# Patient Record
Sex: Male | Born: 1970
Health system: Southern US, Community
[De-identification: ages and names within clinical notes are randomized; demographics above are authoritative.]

## PROBLEM LIST (undated history)

## (undated) DIAGNOSIS — N529 Male erectile dysfunction, unspecified: Secondary | ICD-10-CM

## (undated) DIAGNOSIS — M47812 Spondylosis without myelopathy or radiculopathy, cervical region: Secondary | ICD-10-CM

## (undated) DIAGNOSIS — U071 COVID-19: Secondary | ICD-10-CM

## (undated) DIAGNOSIS — K219 Gastro-esophageal reflux disease without esophagitis: Secondary | ICD-10-CM

## (undated) DIAGNOSIS — F419 Anxiety disorder, unspecified: Secondary | ICD-10-CM

## (undated) DIAGNOSIS — E785 Hyperlipidemia, unspecified: Secondary | ICD-10-CM

## (undated) DIAGNOSIS — R112 Nausea with vomiting, unspecified: Secondary | ICD-10-CM

## (undated) DIAGNOSIS — Z9889 Other specified postprocedural states: Secondary | ICD-10-CM

## (undated) DIAGNOSIS — I10 Essential (primary) hypertension: Secondary | ICD-10-CM

## (undated) DIAGNOSIS — F439 Reaction to severe stress, unspecified: Secondary | ICD-10-CM

## (undated) HISTORY — DX: Reaction to severe stress, unspecified: F43.9

## (undated) HISTORY — DX: Male erectile dysfunction, unspecified: N52.9

## (undated) HISTORY — DX: Spondylosis without myelopathy or radiculopathy, cervical region: M47.812

## (undated) HISTORY — DX: Anxiety disorder, unspecified: F41.9

## (undated) HISTORY — DX: Gastro-esophageal reflux disease without esophagitis: K21.9

## (undated) HISTORY — DX: COVID-19: U07.1

## (undated) HISTORY — DX: Essential (primary) hypertension: I10

## (undated) HISTORY — DX: Hyperlipidemia, unspecified: E78.5

---

## 1990-10-27 HISTORY — PX: HERNIA REPAIR: SHX51

## 2003-10-12 ENCOUNTER — Inpatient Hospital Stay (HOSPITAL_COMMUNITY): Admission: EM | Admit: 2003-10-12 | Discharge: 2003-10-14 | Payer: Self-pay | Admitting: Emergency Medicine

## 2003-10-12 ENCOUNTER — Encounter (INDEPENDENT_AMBULATORY_CARE_PROVIDER_SITE_OTHER): Payer: Self-pay | Admitting: *Deleted

## 2003-10-28 HISTORY — PX: APPENDECTOMY: SHX54

## 2007-06-29 ENCOUNTER — Emergency Department (HOSPITAL_COMMUNITY): Admission: EM | Admit: 2007-06-29 | Discharge: 2007-06-30 | Payer: Self-pay | Admitting: Internal Medicine

## 2008-05-04 ENCOUNTER — Ambulatory Visit: Payer: Self-pay | Admitting: Licensed Clinical Social Worker

## 2010-09-27 ENCOUNTER — Encounter: Admission: RE | Admit: 2010-09-27 | Discharge: 2010-09-27 | Payer: Self-pay | Admitting: Otolaryngology

## 2011-03-14 NOTE — H&P (Signed)
NAME:  Andrew Cunningham, Andrew Cunningham                         ACCOUNT NO.:  [id number]   MEDICAL RECORD NO.:  [id number]                   PATIENT TYPE:  OBV   LOCATION:  0105                                 FACILITY:  [facility]   PHYSICIAN:  Ollen Gross. Vernell Morgans, M.D.              DATE OF BIRTH:  [birth year]   DATE OF ADMISSION:  10/12/2003  DATE OF DISCHARGE:                                HISTORY & PHYSICAL   Andrew Cunningham is a 40 year old white male who presents to the emergency  department today with abdominal pain since Monday.  He states the pain  initially started centrally and then moved to his right lower quadrant.  The  pain has become more severe over the last couple of days, it has been  associated with some nausea but no vomiting.  He denies any fevers.  He  otherwise denies any chest pain, shortness of breath, diarrhea or dysuria.  The rest of his review of systems unremarkable.   PAST MEDICAL HISTORY:  None.   PAST SURGICAL HISTORY:  None.   MEDICATIONS:  None.   ALLERGIES:  NONE.   SOCIAL HISTORY:  He denies the use of tobacco and occasionally drinks  alcohol.   FAMILY HISTORY:  Noncontributory.   PHYSICAL EXAMINATION:  His temp is 98.4, blood pressure 134/87, pulse of 98.  GENERAL:  He is a well-developed, well-nourished white male in no acute  distress.  SKIN:  Warm and dry with no jaundice.  EYES:  Extraocular muscles are intact.  Pupils are equal, round, react to  light.  Sclera nonicteric.  LUNGS:  Clear bilaterally with no use of accessory respiratory muscles.  HEART:  Regular rate and rhythm with an impulse in the left chest.  ABDOMEN:  Soft with some focal moderate right lower quadrant tenderness with  some guarding but no peritonitis.  I cannot palpate masses or  hepatosplenomegaly.  EXTREMITIES:  No cyanosis, clubbing or edema.  PSYCHOLOGIC:  He is alert and oriented x3 with no state of anxiety or  depression.   LABORATORY DATA:  On review of his lab work, his urine was  clean.  His white  count was 13,700, hemoglobin 15.9, hematocrit 46.3, platelet count 249,000.  Sodium was 135, potassium 4.5, chloride 103, CO2 28, BUN 13, creatinine 0.9,  glucose 110, calcium 10.3.   ASSESSMENT AND PLAN:  This is a 40 year old white male with focal right  lower quadrant pain and elevated white count.  His presentation is very  consistent with appendicitis and I would recommend that he undergo  laparoscopic appendectomy today.  I have discussed with him and his family  the risks and benefits of the operation as well as some of the technical  aspects and they understand and wish to proceed.  We will make arrangements  to do this for him in the next couple hours and will start broad-spectrum  antibiotic therapy.  Ollen Gross. Vernell Morgans, M.D.    PST/MEDQ  D:  10/12/2003  T:  10/12/2003  Job:  [id number]

## 2011-03-14 NOTE — Op Note (Signed)
NAME:  Andrew Cunningham, Andrew Cunningham                         ACCOUNT NO.:  [id number]   MEDICAL RECORD NO.:  [id number]                   PATIENT TYPE:  OBV   LOCATION:  0105                                 FACILITY:  [facility]   PHYSICIAN:  Ollen Gross. Vernell Morgans, M.D.              DATE OF BIRTH:  02/03/1971   DATE OF PROCEDURE:  10/12/2003  DATE OF DISCHARGE:                                 OPERATIVE REPORT   PREOPERATIVE DIAGNOSIS:  Appendicitis.   POSTOPERATIVE DIAGNOSIS:  Appendicitis.   PROCEDURE:  Laparoscopic appendectomy.   SURGEON:  Ollen Gross. Carolynne Edouard, M.D.   ANESTHESIA:  General endotracheal.   DESCRIPTION OF PROCEDURE:  After informed consent was obtained, the patient  was brought to the operating room and placed in a supine position on the  operating room table.  After adequate induction of general anesthesia, the  patient's abdomen was prepped with Betadine and draped in the usual sterile  manner.  The area below the umbilicus was then infiltrated with 0.25%  Marcaine.  A small incision was made with the 15 blade knife.  This incision  was carried down through the subcutaneous tissue bluntly with a Kelly clamp  and [employer] retractors until the linea alba was identified.  The linea  alba was incised with the 15 blade knife.  Each side was grasped with Kocher  clamps and elevated anteriorly.  The preperitoneal space was then probed  bluntly with a hemostat until the peritoneum was opened and access was  gained into the abdominal cavity.  A 0 Vicryl figure-of-eight stitch was  then placed in the fascia surrounding the opening.  A Hasson cannula was  placed through the opening and anchored in place with the previously-placed  Vicryl stitch.  The abdomen was then insufflated with carbon dioxide without  difficulty.  The patient was placed in Trendelenburg position with the right  side rotated up.  The right lower quadrant was inspected.  There appeared to  be an inflammatory mass in this area.  In  the epigastric region, an area was  infiltrated with 0.25% Marcaine.  A small stab incision was made with the 15  blade knife, and a 5 mm port was placed bluntly through this incision into  the abdominal cavity under direct vision.  In the suprapubic region, an area  was infiltrated with 0.25% Marcaine.  A small incision was made with the 15  blade knife, and an 11 mm port was placed bluntly through this incision into  the abdominal cavity under direct vision.  Blunt dissection was then carried  out in the right lower quadrant.  An enlarged appendix was identified.  That  appeared to be very inflamed.  The mesoappendix was taken down using the  harmonic scalpel and was hemostatic.  This dissection was carried to the  base of the appendix.  The base of the appendix at its junction with the  cecum was  identified and cleared of any debris.  Next, a laparoscopic GIA  stapler with a blue load was placed through the Hasson cannula.  The  laparoscope was moved to the suprapubic port.  The appendix was held up in  the air, and the stapling device was placed across the base of the appendix  near its junction with the cecum.  The stapling device was clamped.  A  minute was allowed to pass, and then the clamp was fired, thereby dividing  the base of the appendix between staple lines.  An endoscopic bag was then  placed through the Hasson cannula, and the appendix was placed within the  bag, and the bag was sealed.  The abdomen was then irrigated with copious  amounts of saline.  The staple line was then again inspected and was  completely hemostatic and intact.  The tissue appeared to be healthy.  The  rest of the abdomen was irrigated with copious amounts of saline.  There  were no other abnormalities noted on general inspection of the abdomen.  The  appendix was then moved with the bag through the infraumbilical port with  the Hasson cannula.  The fascial defect was then closed with the previously-   placed Vicryl stitch as well as with another interrupted 0 Vicryl stitch.  The rest of the ports were removed under direct vision and were found to be  hemostatic.  The fascia of the suprapubic port was closed with an  interrupted 0 Vicryl stitch.  The skin incisions were all closed with  interrupted 4-0 Monocryl subcuticular stitches.  Benzoin and Steri-Strips  were applied.  The patient tolerated the procedure well.  At the end of the  case, all needle, sponge, and instrument counts were correct.  The patient  was awakened and taken to the recovery room in stable condition.                                               Ollen Gross. Vernell Morgans, M.D.    PST/MEDQ  D:  10/12/2003  T:  10/12/2003  Job:  [id number]

## 2011-10-11 ENCOUNTER — Ambulatory Visit (INDEPENDENT_AMBULATORY_CARE_PROVIDER_SITE_OTHER): Payer: BC Managed Care – PPO

## 2011-10-11 DIAGNOSIS — J019 Acute sinusitis, unspecified: Secondary | ICD-10-CM

## 2012-11-29 IMAGING — CT CT MAXILLOFACIAL W/O CM
3 of 4 series · 17 of 30 positions shown, 19 images · non-contrast
Comparison: None.

CLINICAL DATA: Chronic sinusitis.

CT MAXILLOFACIAL WITHOUT CONTRAST
TECHNIQUE: Multidetector CT imaging of the maxillofacial
structures was performed. Multiplanar CT image reconstructions were
also generated.

[Series 3: sinuses detail · axial · 0.42mm/px · z∈[-15,+70]mm · 5 of 52 slices shown, 7 images]
[im 9/52  brain]
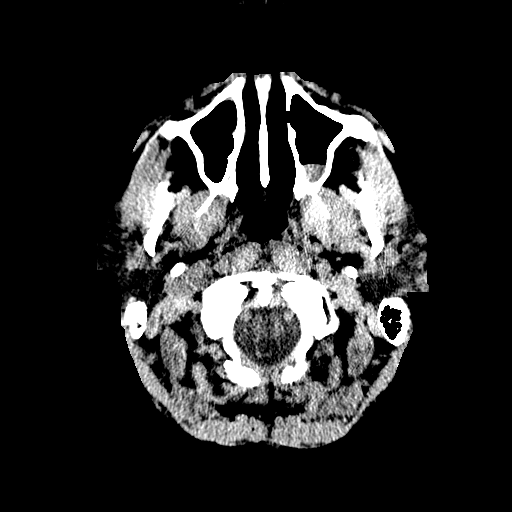
[im 9/52  bone]
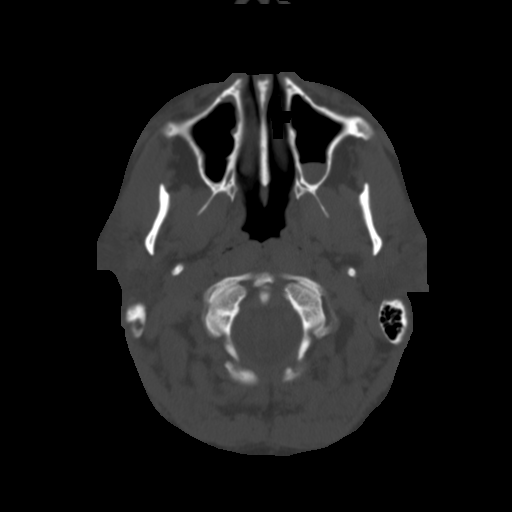
[im 18/52  bone]
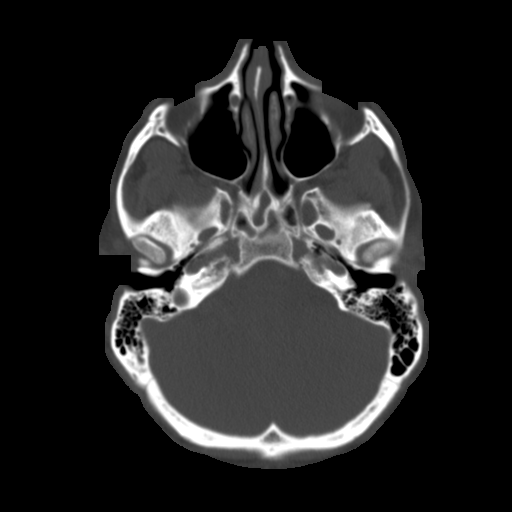
[im 26/52  bone]
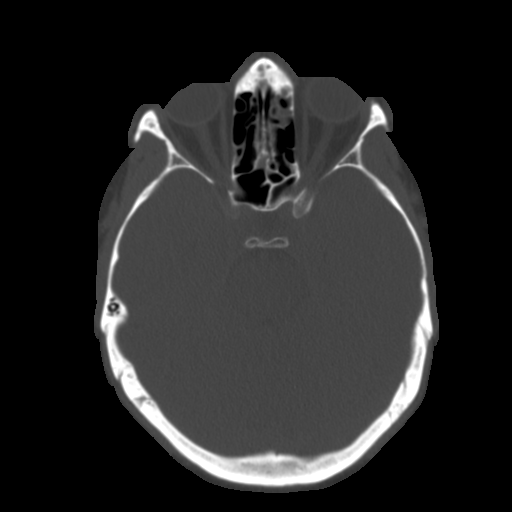
[im 35/52  bone]
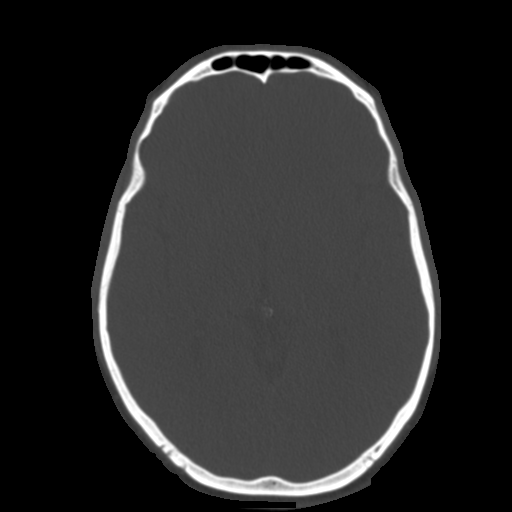
[im 43/52  brain]
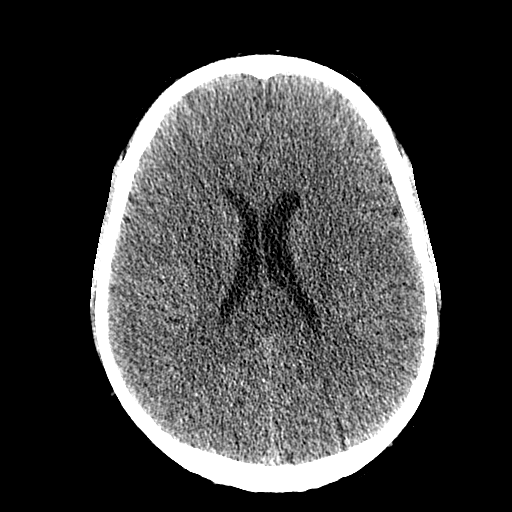
[im 43/52  bone]
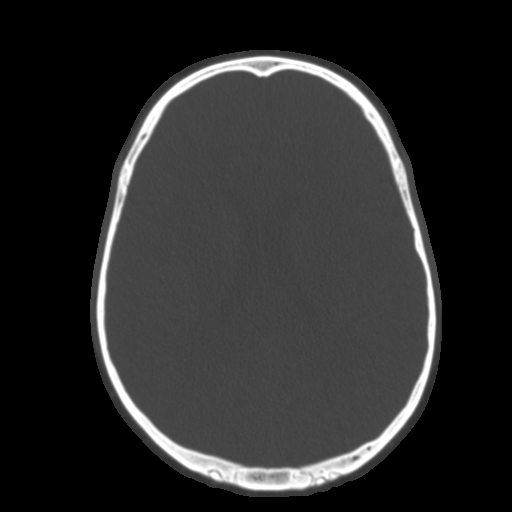

[Series 4: sinuses bone · axial · 0.42mm/px · z∈[-5,+68]mm · 4 of 49 slices shown]
[im 10/49  bone]
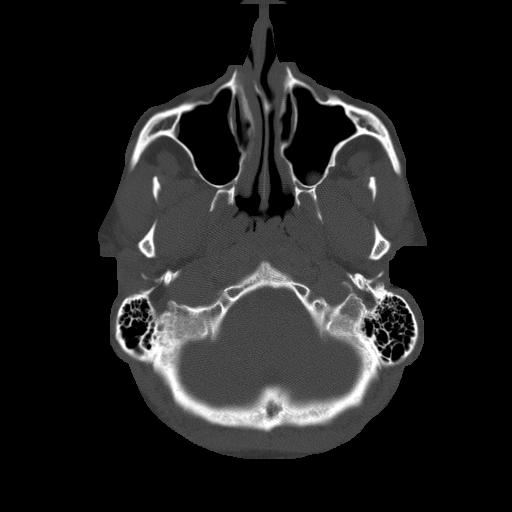
[im 20/49  bone]
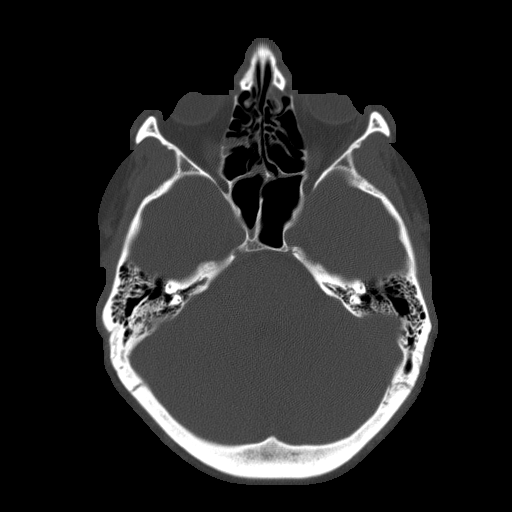
[im 29/49  bone]
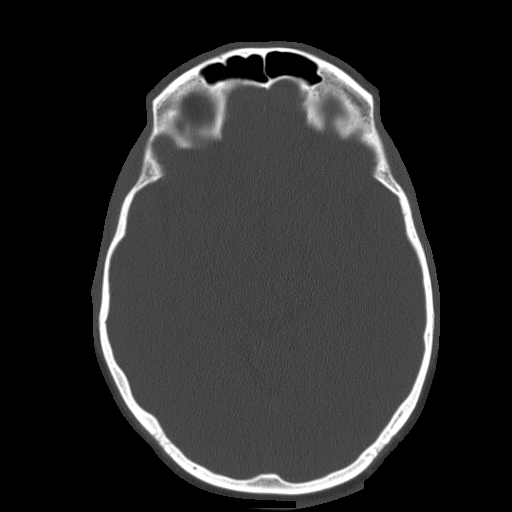
[im 39/49  bone]
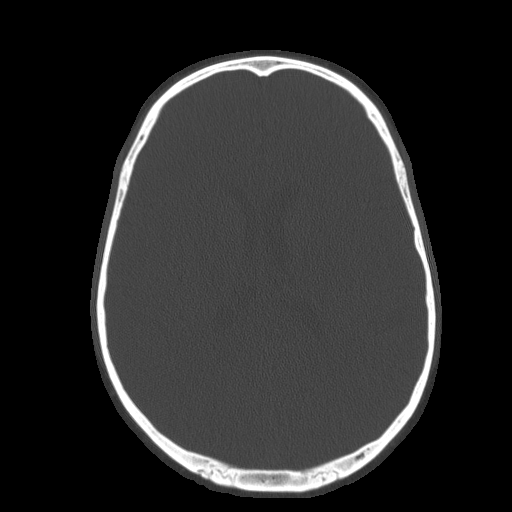

[Series 104: sag · sagittal · 0.33mm/px · 8 of 84 slices shown]
[im 10/84  bone]
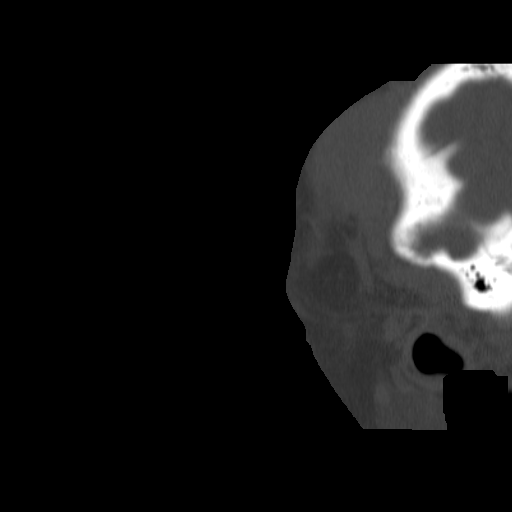
[im 19/84  bone]
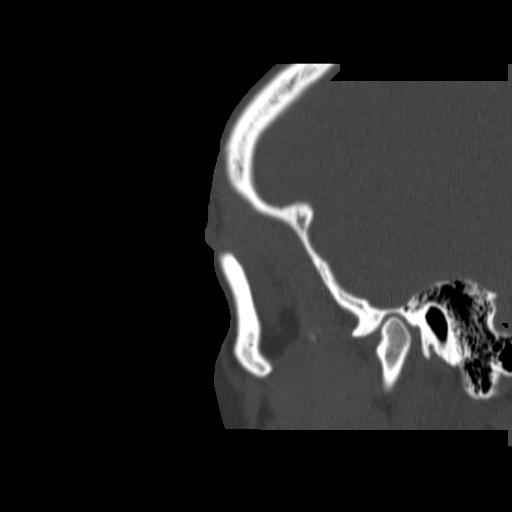
[im 28/84  bone]
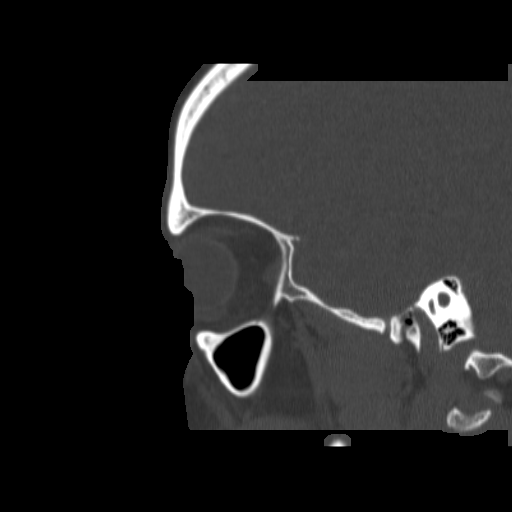
[im 37/84  bone]
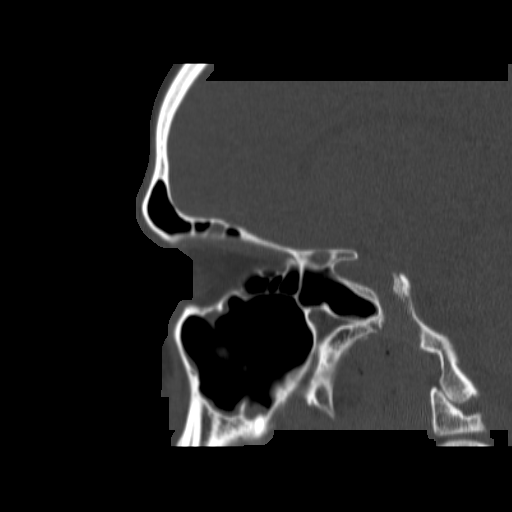
[im 47/84  bone]
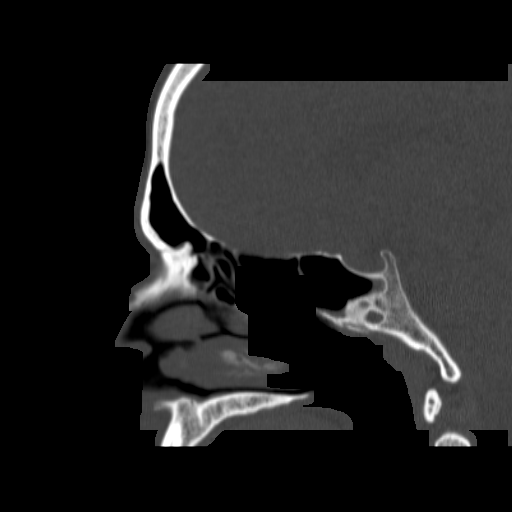
[im 56/84  bone]
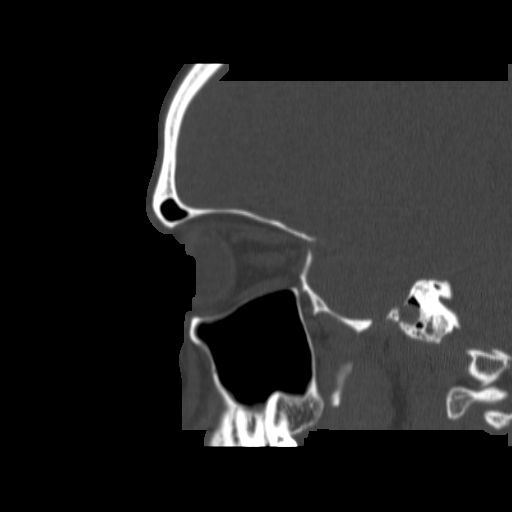
[im 65/84  bone]
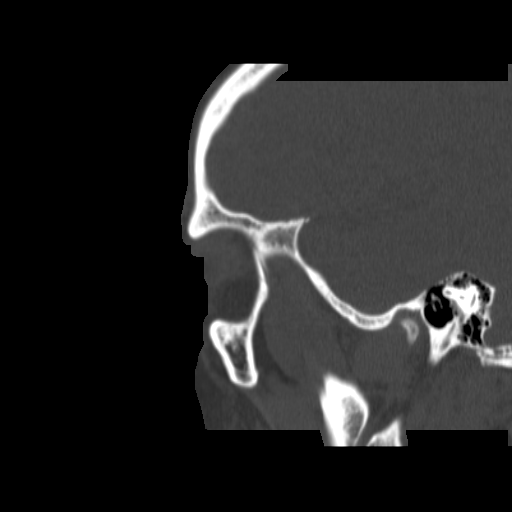
[im 74/84  bone]
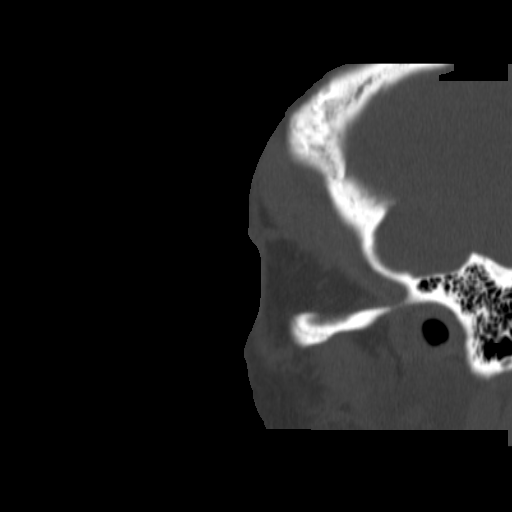

[17 of 30 positions shown; findings below may reference images not displayed]

FINDINGS: Focal soft tissue in the posterior left maxillary sinus
likely represents a small polyp or mucous retention cyst measuring
10 mm maximally.  Minimal mucosal thickening is noted along the
inferior aspect of the maxillary sinus bilaterally.  There is
partial opacification of the anterior left ethmoid air cells
extending towards the left frontal sinus.  The left frontal sinus
is clear.  The right frontal sinus is clear.  Minimal mucosal
thickening is present along the inferior aspect of the sphenoid
sinus bilaterally.  No air-fluid levels are present.  No focal bone
erosion or thickening is identified.

The ostiomeatal complex is present bilaterally.  The mastoid air
cells are clear.

Limited imaging of the brain is unremarkable.

The osseous skull is intact.
IMPRESSION: 1.  Small polyp or mucous retention cyst along the posterior aspect
of the left maxillary sinus.
2.  Minimal opacification of the anterior left ethmoid air cells
and frontoethmoidal recess without obstruction of the left frontal
sinus.
3.  Minimal mucosal thickening along the inferior aspect of the
sphenoid sinuses bilaterally.

## 2016-10-07 ENCOUNTER — Ambulatory Visit (INDEPENDENT_AMBULATORY_CARE_PROVIDER_SITE_OTHER): Payer: 59

## 2016-10-07 ENCOUNTER — Ambulatory Visit (INDEPENDENT_AMBULATORY_CARE_PROVIDER_SITE_OTHER): Payer: 59 | Admitting: Podiatry

## 2016-10-07 ENCOUNTER — Encounter: Payer: Self-pay | Admitting: Podiatry

## 2016-10-07 VITALS — BP 129/85 | HR 68 | Resp 16 | Ht 71.0 in | Wt 180.0 lb

## 2016-10-07 DIAGNOSIS — M79671 Pain in right foot: Secondary | ICD-10-CM

## 2016-10-07 DIAGNOSIS — M722 Plantar fascial fibromatosis: Secondary | ICD-10-CM

## 2016-10-07 DIAGNOSIS — M7661 Achilles tendinitis, right leg: Secondary | ICD-10-CM

## 2016-10-07 DIAGNOSIS — M79672 Pain in left foot: Secondary | ICD-10-CM

## 2016-10-07 MED ORDER — METHYLPREDNISOLONE 4 MG PO TBPK: ORAL_TABLET | ORAL | 0 refills | Status: DC

## 2016-10-07 MED ORDER — MELOXICAM 15 MG PO TABS: 15.0000 mg | ORAL_TABLET | Freq: Every day | ORAL | 3 refills | Status: DC

## 2016-10-07 NOTE — Patient Instructions (Signed)
Plantar Fasciitis Plantar fasciitis is a painful foot condition that affects the heel. It occurs when the band of tissue that connects the toes to the heel bone (plantar fascia) becomes irritated. This can happen after exercising too much or doing other repetitive activities (overuse injury). The pain from plantar fasciitis can range from mild irritation to severe pain that makes it difficult for you to walk or move. The pain is usually worse in the morning or after you have been sitting or lying down for a while. CAUSES This condition may be caused by:  Standing for long periods of time.  Wearing shoes that do not fit.  Doing high-impact activities, including running, aerobics, and ballet.  Being overweight.  Having an abnormal way of walking (gait).  Having tight calf muscles.  Having high arches in your feet.  Starting a new athletic activity. SYMPTOMS The main symptom of this condition is heel pain. Other symptoms include:  Pain that gets worse after activity or exercise.  Pain that is worse in the morning or after resting.  Pain that goes away after you walk for a few minutes. DIAGNOSIS This condition may be diagnosed based on your signs and symptoms. Your health care provider will also do a physical exam to check for:  A tender area on the bottom of your foot.  A high arch in your foot.  Pain when you move your foot.  Difficulty moving your foot. You may also need to have imaging studies to confirm the diagnosis. These can include:  X-rays.  Ultrasound.  MRI. TREATMENT  Treatment for plantar fasciitis depends on the severity of the condition. Your treatment may include:  Rest, ice, and over-the-counter pain medicines to manage your pain.  Exercises to stretch your calves and your plantar fascia.  A splint that holds your foot in a stretched, upward position while you sleep (night splint).  Physical therapy to relieve symptoms and prevent problems in the  future.  Cortisone injections to relieve severe pain.  Extracorporeal shock wave therapy (ESWT) to stimulate damaged plantar fascia with electrical impulses. It is often used as a last resort before surgery.  Surgery, if other treatments have not worked after 12 months. HOME CARE INSTRUCTIONS  Take medicines only as directed by your health care provider.  Avoid activities that cause pain.  Roll the bottom of your foot over a bag of ice or a bottle of cold water. Do this for 20 minutes, 3-4 times a day.  Perform simple stretches as directed by your health care provider.  Try wearing athletic shoes with air-sole or gel-sole cushions or soft shoe inserts.  Wear a night splint while sleeping, if directed by your health care provider.  Keep all follow-up appointments with your health care provider. PREVENTION   Do not perform exercises or activities that cause heel pain.  Consider finding low-impact activities if you continue to have problems.  Lose weight if you need to. The best way to prevent plantar fasciitis is to avoid the activities that aggravate your plantar fascia. SEEK MEDICAL CARE IF:  Your symptoms do not go away after treatment with home care measures.  Your pain gets worse.  Your pain affects your ability to move or do your daily activities. This information is not intended to replace advice given to you by your health care provider. Make sure you discuss any questions you have with your health care provider. Document Released: 07/08/2001 Document Revised: 02/04/2016 Document Reviewed: 08/23/2014 Elsevier Interactive Patient Education    2017 [employer]  

## 2016-10-07 NOTE — Progress Notes (Signed)
   Subjective:    Patient ID: Andrew Cunningham, male    DOB: 1970-11-29, 45 y.o.   MRN: [id number]  HPI: He presents today with chief complaint of pain to the posterior inferior aspect of the right heel times past 2 months. He also relates some left foot pain to the plantar aspect of the left foot states that it feels like the entire bottom of the foot is hurting.  Review of Systems  All other systems reviewed and are negative.      Objective:   Physical Exam: Vital signs are stable alert and oriented 3. Pulses are palpable. Neurologic sensorium is intact deep tendon reflexes are intact muscle strength is symmetrical and equal bilateral. Orthopedic evaluation straight awl joint systems available range of motion without crepitus. He does have pain on palpation of the posterior lateral insertion site of the Achilles right heel with what appears to be a very sharp lateral calcaneal margin or even small spur. He also has pain on palpation medial calcaneal tubercle of the left heel pain on medial and lateral compression of either heel and no pain on direct palpation posterior tibial tendon the Achilles tendon. Cutaneous evaluations are supple. Cutis no erythema cellulitis drainage or odor. Radiographs 3 views taken in the office today demonstrates soft tissue increased density of the plantar fascia calcaneal insertion site of the left heel and a small spur with minimal thickening of the Achilles and the posterior inferior aspect of the right calcaneus.        Assessment & Plan:  Insertional Achilles tendinitis right plantar fasciitis left.  Plan: I offered him injections but he declined today. I started him on a Medrol Dosepak to be followed by meloxicam. Since stretching exercises ice therapy and especially your modifications. I will follow-up with him in 1 month and we will consider orthotics and/or injections.

## 2016-10-09 ENCOUNTER — Telehealth: Payer: Self-pay | Admitting: *Deleted

## 2016-10-09 NOTE — Telephone Encounter (Signed)
Pt states he is unable to sleep while taking the prednisone and would like to know if he could take xanax. Left message informing pt that as long as it was his xanax it would be fine, but don't take at the same time.

## 2016-11-11 ENCOUNTER — Ambulatory Visit: Payer: 59 | Admitting: Podiatry

## 2017-02-27 DIAGNOSIS — Z0000 Encounter for general adult medical examination without abnormal findings: Secondary | ICD-10-CM | POA: Diagnosis not present

## 2017-02-27 DIAGNOSIS — K219 Gastro-esophageal reflux disease without esophagitis: Secondary | ICD-10-CM | POA: Diagnosis not present

## 2017-02-27 DIAGNOSIS — I10 Essential (primary) hypertension: Secondary | ICD-10-CM | POA: Diagnosis not present

## 2017-02-27 DIAGNOSIS — E7800 Pure hypercholesterolemia, unspecified: Secondary | ICD-10-CM | POA: Diagnosis not present

## 2017-02-27 DIAGNOSIS — R7309 Other abnormal glucose: Secondary | ICD-10-CM | POA: Diagnosis not present

## 2017-06-12 DIAGNOSIS — I10 Essential (primary) hypertension: Secondary | ICD-10-CM | POA: Diagnosis not present

## 2017-06-12 DIAGNOSIS — K219 Gastro-esophageal reflux disease without esophagitis: Secondary | ICD-10-CM | POA: Diagnosis not present

## 2017-06-12 DIAGNOSIS — E7800 Pure hypercholesterolemia, unspecified: Secondary | ICD-10-CM | POA: Diagnosis not present

## 2017-08-23 DIAGNOSIS — Z23 Encounter for immunization: Secondary | ICD-10-CM | POA: Diagnosis not present

## 2017-09-04 ENCOUNTER — Ambulatory Visit (INDEPENDENT_AMBULATORY_CARE_PROVIDER_SITE_OTHER): Payer: 59 | Admitting: Podiatry

## 2017-09-04 ENCOUNTER — Other Ambulatory Visit: Payer: Self-pay | Admitting: Podiatry

## 2017-09-04 ENCOUNTER — Ambulatory Visit (INDEPENDENT_AMBULATORY_CARE_PROVIDER_SITE_OTHER): Payer: 59

## 2017-09-04 ENCOUNTER — Encounter: Payer: Self-pay | Admitting: Podiatry

## 2017-09-04 VITALS — BP 139/94 | HR 65

## 2017-09-04 DIAGNOSIS — R52 Pain, unspecified: Secondary | ICD-10-CM

## 2017-09-04 DIAGNOSIS — M7661 Achilles tendinitis, right leg: Secondary | ICD-10-CM

## 2017-09-04 DIAGNOSIS — M722 Plantar fascial fibromatosis: Secondary | ICD-10-CM

## 2017-09-04 DIAGNOSIS — M79672 Pain in left foot: Secondary | ICD-10-CM

## 2017-09-04 NOTE — Patient Instructions (Signed)

## 2017-09-08 ENCOUNTER — Ambulatory Visit: Payer: 59 | Admitting: Orthotics

## 2017-09-08 DIAGNOSIS — R52 Pain, unspecified: Secondary | ICD-10-CM

## 2017-09-08 DIAGNOSIS — M7661 Achilles tendinitis, right leg: Secondary | ICD-10-CM

## 2017-09-08 DIAGNOSIS — M79671 Pain in right foot: Secondary | ICD-10-CM

## 2017-09-08 DIAGNOSIS — M79672 Pain in left foot: Secondary | ICD-10-CM

## 2017-09-08 NOTE — Progress Notes (Signed)
Patient came into today for casting bilateral f/o to address plantar fasciitis, metatarsalgia, and achilles tendon.  Patient reports history of foot pain involving plantar aponeurosis.  Goal is to provide longitudinal arch support and correct any RF instability due to heel eversion/inversion.  Ultimate goal is to relieve tension at pf insertion calcaneal tuberosity.  Plan on semi-rigid device addressing heel stability and relieving PF tension.

## 2017-09-30 ENCOUNTER — Encounter: Payer: 59 | Admitting: Orthotics

## 2017-10-02 ENCOUNTER — Ambulatory Visit: Payer: 59 | Admitting: Orthotics

## 2017-10-02 DIAGNOSIS — M7661 Achilles tendinitis, right leg: Secondary | ICD-10-CM

## 2017-10-04 NOTE — Progress Notes (Signed)
Patient picked up f/o; he was overall pleased with fit and function of said device; however he did feel that the arch wasn't hitting just right with the apex just proximal to mid arch.  He will wear a few days to see if foot adjusts; I checked them and it seemed like apex was in right place.

## 2017-10-05 ENCOUNTER — Encounter: Payer: 59 | Admitting: Orthotics

## 2017-11-06 DIAGNOSIS — C3432 Malignant neoplasm of lower lobe, left bronchus or lung: Secondary | ICD-10-CM | POA: Diagnosis not present

## 2017-11-06 DIAGNOSIS — R748 Abnormal levels of other serum enzymes: Secondary | ICD-10-CM | POA: Diagnosis not present

## 2017-11-06 DIAGNOSIS — Z5111 Encounter for antineoplastic chemotherapy: Secondary | ICD-10-CM | POA: Diagnosis not present

## 2017-11-06 DIAGNOSIS — R202 Paresthesia of skin: Secondary | ICD-10-CM | POA: Diagnosis not present

## 2017-11-27 DIAGNOSIS — C770 Secondary and unspecified malignant neoplasm of lymph nodes of head, face and neck: Secondary | ICD-10-CM | POA: Diagnosis not present

## 2017-11-27 DIAGNOSIS — C3432 Malignant neoplasm of lower lobe, left bronchus or lung: Secondary | ICD-10-CM | POA: Diagnosis not present

## 2017-11-27 DIAGNOSIS — Z5111 Encounter for antineoplastic chemotherapy: Secondary | ICD-10-CM | POA: Diagnosis not present

## 2017-11-30 DIAGNOSIS — E7800 Pure hypercholesterolemia, unspecified: Secondary | ICD-10-CM | POA: Diagnosis not present

## 2017-11-30 DIAGNOSIS — K219 Gastro-esophageal reflux disease without esophagitis: Secondary | ICD-10-CM | POA: Diagnosis not present

## 2017-11-30 DIAGNOSIS — I10 Essential (primary) hypertension: Secondary | ICD-10-CM | POA: Diagnosis not present

## 2017-12-28 DIAGNOSIS — M1711 Unilateral primary osteoarthritis, right knee: Secondary | ICD-10-CM | POA: Diagnosis not present

## 2017-12-28 DIAGNOSIS — M25561 Pain in right knee: Secondary | ICD-10-CM | POA: Diagnosis not present

## 2018-01-01 DIAGNOSIS — S83241D Other tear of medial meniscus, current injury, right knee, subsequent encounter: Secondary | ICD-10-CM | POA: Diagnosis not present

## 2018-01-01 DIAGNOSIS — M25561 Pain in right knee: Secondary | ICD-10-CM | POA: Diagnosis not present

## 2018-01-08 DIAGNOSIS — S83241D Other tear of medial meniscus, current injury, right knee, subsequent encounter: Secondary | ICD-10-CM | POA: Diagnosis not present

## 2018-01-11 DIAGNOSIS — H00012 Hordeolum externum right lower eyelid: Secondary | ICD-10-CM | POA: Diagnosis not present

## 2018-02-19 DIAGNOSIS — S83241D Other tear of medial meniscus, current injury, right knee, subsequent encounter: Secondary | ICD-10-CM | POA: Diagnosis not present

## 2018-03-16 ENCOUNTER — Ambulatory Visit: Payer: 59 | Admitting: Orthotics

## 2018-03-16 DIAGNOSIS — M7661 Achilles tendinitis, right leg: Secondary | ICD-10-CM

## 2018-03-16 DIAGNOSIS — M722 Plantar fascial fibromatosis: Secondary | ICD-10-CM

## 2018-03-16 NOTE — Progress Notes (Signed)
Adjust f/o:  suborthone (180), hug arch, remove met pad, 3* valgus wedge, 1/8" dancers pad skived distal and proximal to met heads.

## 2018-03-29 ENCOUNTER — Other Ambulatory Visit: Payer: 59 | Admitting: Orthotics

## 2018-04-05 ENCOUNTER — Ambulatory Visit: Payer: 59 | Admitting: Orthotics

## 2018-04-05 DIAGNOSIS — M7661 Achilles tendinitis, right leg: Secondary | ICD-10-CM

## 2018-04-05 DIAGNOSIS — M722 Plantar fascial fibromatosis: Secondary | ICD-10-CM

## 2018-04-05 NOTE — Progress Notes (Signed)
Patient still not completely happy with f/o; said he fell the apex of the f/o was too far distal now; however, it was placed on his feet and the apex hit perfectly at the middle of the arch with full contour.   Patient will try in atheltic shoes as he only brought in golf shoes.

## 2018-08-27 DIAGNOSIS — Z23 Encounter for immunization: Secondary | ICD-10-CM | POA: Diagnosis not present

## 2018-11-19 DIAGNOSIS — M19012 Primary osteoarthritis, left shoulder: Secondary | ICD-10-CM | POA: Diagnosis not present

## 2018-11-19 DIAGNOSIS — M722 Plantar fascial fibromatosis: Secondary | ICD-10-CM | POA: Diagnosis not present

## 2018-11-19 DIAGNOSIS — M25512 Pain in left shoulder: Secondary | ICD-10-CM | POA: Diagnosis not present

## 2020-05-28 ENCOUNTER — Other Ambulatory Visit: Payer: Self-pay | Admitting: Physician Assistant

## 2020-05-28 DIAGNOSIS — Z6825 Body mass index (BMI) 25.0-25.9, adult: Secondary | ICD-10-CM

## 2020-05-28 DIAGNOSIS — U071 COVID-19: Secondary | ICD-10-CM

## 2020-05-28 NOTE — Progress Notes (Signed)
I connected by phone with Andrew Cunningham on 05/28/2020 at 10:46 AM to discuss the potential use of a new treatment for mild to moderate COVID-19 viral infection in non-hospitalized patients.  This patient is a 49 y.o. male that meets the FDA criteria for Emergency Use Authorization of COVID monoclonal antibody casirivimab/imdevimab. Has a (+) direct SARS-CoV-2 viral test result Has mild or moderate COVID-19  Is NOT hospitalized due to COVID-19 Is within 10 days of symptom onset Has at least one of the high risk factor(s) for progression to severe COVID-19 and/or hospitalization as defined in EUA. Specific high risk criteria : BMI > 25   I have spoken and communicated the following to the patient or parent/caregiver regarding COVID monoclonal antibody treatment:  FDA has authorized the emergency use for the treatment of mild to moderate COVID-19 in adults and pediatric patients with positive results of direct SARS-CoV-2 viral testing who are 68 years of age and older weighing at least 40 kg, and who are at high risk for progressing to severe COVID-19 and/or hospitalization.  The significant known and potential risks and benefits of COVID monoclonal antibody, and the extent to which such potential risks and benefits are unknown.  Information on available alternative treatments and the risks and benefits of those alternatives, including clinical trials.  Patients treated with COVID monoclonal antibody should continue to self-isolate and use infection control measures (e.g., wear mask, isolate, social distance, avoid sharing personal items, clean and disinfect "high touch" surfaces, and frequent handwashing) according to CDC guidelines.   The patient or parent/caregiver has the option to accept or refuse COVID monoclonal antibody treatment.  After reviewing this information with the patient, The patient agreed to proceed with receiving casirivimab\imdevimab infusion and will be provided a copy of  the Fact sheet prior to receiving the infusion.  Sx onset 7/30. Set up for infusion on 8/4 @ 12:30 . Directions given to [facility]. Pt is aware that insurance will be charged an infusion fee.   Angelena Form 05/28/2020 10:46 AM

## 2020-05-30 ENCOUNTER — Ambulatory Visit (HOSPITAL_COMMUNITY): Payer: Self-pay

## 2020-08-09 NOTE — Progress Notes (Signed)
Office Visit Note  Patient: Andrew Cunningham             Date of Birth: [birth year]           MRN: [id number]             PCP: [name], MD Referring: [name], MD Visit Date: 08/22/2020 Occupation: @GUAROCC @  Subjective:  Neck pain.   History of Present Illness: Andrew Cunningham is a 49 y.o. male seen in consultation per request of his PCP.  According the patient he has had discomfort in his neck for the last few years.  He was seen by orthopedic surgeon in the past and was given Medrol pack without any help.  He denies any radiculopathy.  He states that the pain is worse in the morning and then it gets better.  He denies any nocturnal pain.  He is uncertain if he is a side sleeper or a back sleeper.  He denies any thoracic or lumbar pain.  Activities of Daily Living:  Patient reports morning stiffness for 0 minutes.   Patient Denies nocturnal pain.  Difficulty dressing/grooming: Denies Difficulty climbing stairs: Denies Difficulty getting out of chair: Denies Difficulty using hands for taps, buttons, cutlery, and/or writing: Denies  Review of Systems  Constitutional: Negative for fatigue.  HENT: Negative for mouth sores, mouth dryness and nose dryness.   Eyes: Negative for pain, itching and dryness.  Respiratory: Negative for shortness of breath and difficulty breathing.   Cardiovascular: Negative for chest pain and palpitations.  Gastrointestinal: Negative for blood in stool, constipation and diarrhea.  Endocrine: Negative for increased urination.  Genitourinary: Negative for difficulty urinating and painful urination.  Musculoskeletal: Positive for arthralgias and joint pain. Negative for joint swelling, myalgias, morning stiffness, muscle tenderness and myalgias.  Skin: Negative for color change, rash and redness.  Allergic/Immunologic: Negative for susceptible to infections.  Neurological: Negative for dizziness, numbness, headaches, memory loss and weakness.    Hematological: Negative for bruising/bleeding tendency.  Psychiatric/Behavioral: Negative for confusion and sleep disturbance.    PMFS History:  Patient Active Problem List   Diagnosis Date Noted  . Cervical spine arthritis 08/22/2020  . History of anxiety 08/22/2020  . History of gastroesophageal reflux (GERD) 08/22/2020  . Essential hypertension 08/22/2020  . History of hyperlipidemia 08/22/2020    Past Medical History:  Diagnosis Date  . Anxiety    per patient   . GERD (gastroesophageal reflux disease)    per patient   . Hyperlipemia    per patient   . Hypertension    per patient     Family History  Problem Relation Age of Onset  . Healthy Mother   . Arthritis Mother   . Healthy Father   . Healthy Sister   . Healthy Daughter    Past Surgical History:  Procedure Laterality Date  . APPENDECTOMY  2005  . [place]   Social History   Social History Narrative  . Not on file   Immunization History  Administered Date(s) Administered  . PFIZER SARS-COV-2 Vaccination 11/21/2019, 12/12/2019     Objective: Vital Signs: BP (!) 139/93 (BP Location: Right Arm, Patient Position: Sitting, Cuff Size: Normal)   Pulse 68   Resp 15   Ht 5\' 11"  (1.803 m)   Wt 187 lb (84.8 kg)   BMI 26.08 kg/m    Physical Exam Vitals and nursing note reviewed.  Constitutional:      Appearance: He is well-developed.  HENT:  Head: Normocephalic and atraumatic.  Eyes:     Conjunctiva/sclera: Conjunctivae normal.     Pupils: Pupils are equal, round, and reactive to light.  Cardiovascular:     Rate and Rhythm: Normal rate and regular rhythm.     Heart sounds: Normal heart sounds.  Pulmonary:     Effort: Pulmonary effort is normal.     Breath sounds: Normal breath sounds.  Abdominal:     General: Bowel sounds are normal.     Palpations: Abdomen is soft.  Musculoskeletal:     Cervical back: Normal range of motion and neck supple.  Skin:    General: Skin is warm and  dry.     Capillary Refill: Capillary refill takes less than 2 seconds.     Comments: Lipoma noted over left shoulder  Neurological:     Mental Status: He is alert and oriented to person, place, and time.  Psychiatric:        Behavior: Behavior normal.      Musculoskeletal Exam: C-spine was in good range of motion without any radiculopathy.  Thoracic and lumbar spine with good range of motion.  Shoulder joints, elbow joints, wrist joints, MCPs PIPs and DIPs in good range of motion.  He has mild DIP thickening.  Hip joints, knee joints, ankles, MTPs and PIPs with good range of motion with no synovitis.  CDAI Exam: CDAI Score: -- Patient Global: --; Provider Global: -- Swollen: --; Tender: -- Joint Exam 08/22/2020   No joint exam has been documented for this visit   There is currently no information documented on the homunculus. Go to the Rheumatology activity and complete the homunculus joint exam.  Investigation: No additional findings.  Imaging: XR Cervical Spine 2 or 3 views  Result Date: 08/22/2020 C5-C6 narrowing was noted with anterior osteophytes.  Facet joint arthropathy was noted. Impression: These findings are consistent with cervical spondylosis and facet joint arthropathy.   Recent Labs: No results found for: WBC, HGB, PLT, NA, K, CL, CO2, GLUCOSE, BUN, CREATININE, BILITOT, ALKPHOS, AST, ALT, PROT, ALBUMIN, CALCIUM, GFRAA, QFTBGOLD, QFTBGOLDPLUS  Speciality Comments: No specialty comments available.  Procedures:  No procedures performed Allergies: Patient has no known allergies.   Assessment / Plan:     Visit Diagnoses: Cervical spine arthritis -he complains of pain and stiffness in his cervical spine for the last few years.  He had no radiculopathy.  He has tried Medrol Dosepak in the past without relief.  Plan: XR Cervical Spine 2 or 3 views, x-rays obtained today showed C5C6 disc space narrowing and facet joint arthropathy.  Some neck exercises were demonstrated  in the office.  I will also refer him to physical therapy.  Ambulatory referral to Physical Therapy.  Have advised him to return on as needed basis if symptoms persist.  Chronic lower back pain without sciatica-he states he has some discomfort in his lower back when he is playing golf.  He takes Advil on as needed basis.  Have given him a handout on back exercises.  Osteoarthritis of bilateral hands-he has bilateral DIP thickening.  He is asymptomatic.  Joint protection was discussed.  History of anxiety  History of gastroesophageal reflux (GERD)  Essential hypertension  History of hyperlipidemia  Orders: Orders Placed This Encounter  Procedures  . XR Cervical Spine 2 or 3 views  . Ambulatory referral to Physical Therapy   No orders of the defined types were placed in this encounter.     Follow-Up Instructions: Return if symptoms worsen  or fail to improve, for Neck pain.   Bo Merino, MD  Note - This record has been created using Editor, commissioning.  Chart creation errors have been sought, but may not always  have been located. Such creation errors do not reflect on  the standard of medical care.

## 2020-08-22 ENCOUNTER — Ambulatory Visit: Payer: Self-pay

## 2020-08-22 ENCOUNTER — Encounter: Payer: Self-pay | Admitting: Rheumatology

## 2020-08-22 ENCOUNTER — Other Ambulatory Visit: Payer: Self-pay

## 2020-08-22 ENCOUNTER — Ambulatory Visit: Payer: No Typology Code available for payment source | Admitting: Rheumatology

## 2020-08-22 VITALS — BP 139/93 | HR 68 | Resp 15 | Ht 71.0 in | Wt 187.0 lb

## 2020-08-22 DIAGNOSIS — Z8659 Personal history of other mental and behavioral disorders: Secondary | ICD-10-CM | POA: Diagnosis not present

## 2020-08-22 DIAGNOSIS — G8929 Other chronic pain: Secondary | ICD-10-CM

## 2020-08-22 DIAGNOSIS — Z8639 Personal history of other endocrine, nutritional and metabolic disease: Secondary | ICD-10-CM

## 2020-08-22 DIAGNOSIS — M19042 Primary osteoarthritis, left hand: Secondary | ICD-10-CM

## 2020-08-22 DIAGNOSIS — M47812 Spondylosis without myelopathy or radiculopathy, cervical region: Secondary | ICD-10-CM | POA: Diagnosis not present

## 2020-08-22 DIAGNOSIS — M545 Low back pain, unspecified: Secondary | ICD-10-CM

## 2020-08-22 DIAGNOSIS — M79671 Pain in right foot: Secondary | ICD-10-CM

## 2020-08-22 DIAGNOSIS — M19041 Primary osteoarthritis, right hand: Secondary | ICD-10-CM

## 2020-08-22 DIAGNOSIS — Z8719 Personal history of other diseases of the digestive system: Secondary | ICD-10-CM

## 2020-08-22 DIAGNOSIS — I10 Essential (primary) hypertension: Secondary | ICD-10-CM

## 2020-08-22 NOTE — Patient Instructions (Addendum)
Cervical Strain and Sprain Rehab Ask your health care provider which exercises are safe for you. Do exercises exactly as told by your health care provider and adjust them as directed. It is normal to feel mild stretching, pulling, tightness, or discomfort as you do these exercises. Stop right away if you feel sudden pain or your pain gets worse. Do not begin these exercises until told by your health care provider. Stretching and range-of-motion exercises Cervical side bending  1. Using good posture, sit on a stable chair or stand up. 2. Without moving your shoulders, slowly tilt your left / right ear to your shoulder until you feel a stretch in the opposite side neck muscles. You should be looking straight ahead. 3. Hold for __________ seconds. 4. Repeat with the other side of your neck. Repeat __________ times. Complete this exercise __________ times a day. Cervical rotation  1. Using good posture, sit on a stable chair or stand up. 2. Slowly turn your head to the side as if you are looking over your left / right shoulder. ? Keep your eyes level with the ground. ? Stop when you feel a stretch along the side and the back of your neck. 3. Hold for __________ seconds. 4. Repeat this by turning to your other side. Repeat __________ times. Complete this exercise __________ times a day. Thoracic extension and pectoral stretch 1. Roll a towel or a small blanket so it is about 4 inches (10 cm) in diameter. 2. Lie down on your back on a firm surface. 3. Put the towel lengthwise, under your spine in the middle of your back. It should not be under your shoulder blades. The towel should line up with your spine from your middle back to your lower back. 4. Put your hands behind your head and let your elbows fall out to your sides. 5. Hold for __________ seconds. Repeat __________ times. Complete this exercise __________ times a day. Strengthening exercises Isometric upper cervical flexion 1. Lie on  your back with a thin pillow behind your head and a small rolled-up towel under your neck. 2. Gently tuck your chin toward your chest and nod your head down to look toward your feet. Do not lift your head off the pillow. 3. Hold for __________ seconds. 4. Release the tension slowly. Relax your neck muscles completely before you repeat this exercise. Repeat __________ times. Complete this exercise __________ times a day. Isometric cervical extension  1. Stand about 6 inches (15 cm) away from a wall, with your back facing the wall. 2. Place a soft object, about 6-8 inches (15-20 cm) in diameter, between the back of your head and the wall. A soft object could be a small pillow, a ball, or a folded towel. 3. Gently tilt your head back and press into the soft object. Keep your jaw and forehead relaxed. 4. Hold for __________ seconds. 5. Release the tension slowly. Relax your neck muscles completely before you repeat this exercise. Repeat __________ times. Complete this exercise __________ times a day. Posture and body mechanics Body mechanics refers to the movements and positions of your body while you do your daily activities. Posture is part of body mechanics. Good posture and healthy body mechanics can help to relieve stress in your body's tissues and joints. Good posture means that your spine is in its natural S-curve position (your spine is neutral), your shoulders are pulled back slightly, and your head is not tipped forward. The following are general guidelines for applying improved  posture and body mechanics to your everyday activities. Sitting  1. When sitting, keep your spine neutral and keep your feet flat on the floor. Use a footrest, if necessary, and keep your thighs parallel to the floor. Avoid rounding your shoulders, and avoid tilting your head forward. 2. When working at a desk or a computer, keep your desk at a height where your hands are slightly lower than your elbows. Slide your  chair under your desk so you are close enough to maintain good posture. 3. When working at a computer, place your monitor at a height where you are looking straight ahead and you do not have to tilt your head forward or downward to look at the screen. Standing   When standing, keep your spine neutral and keep your feet about hip-width apart. Keep a slight bend in your knees. Your ears, shoulders, and hips should line up.  When you do a task in which you stand in one place for a long time, place one foot up on a stable object that is 2-4 inches (5-10 cm) high, such as a footstool. This helps keep your spine neutral. Resting When lying down and resting, avoid positions that are most painful for you. Try to support your neck in a neutral position. You can use a contour pillow or a small rolled-up towel. Your pillow should support your neck but not push on it. This information is not intended to replace advice given to you by your health care provider. Make sure you discuss any questions you have with your health care provider. Document Revised: 02/02/2019 Document Reviewed: 07/14/2018 Elsevier Patient Education  2020 [place].  Back Exercises The following exercises strengthen the muscles that help to support the trunk and back. They also help to keep the lower back flexible. Doing these exercises can help to prevent back pain or lessen existing pain.  If you have back pain or discomfort, try doing these exercises 2-3 times each day or as told by your health care provider.  As your pain improves, do them once each day, but increase the number of times that you repeat the steps for each exercise (do more repetitions).  To prevent the recurrence of back pain, continue to do these exercises once each day or as told by your health care provider. Do exercises exactly as told by your health care provider and adjust them as directed. It is normal to feel mild stretching, pulling, tightness, or  discomfort as you do these exercises, but you should stop right away if you feel sudden pain or your pain gets worse. Exercises Single knee to chest Repeat these steps 3-5 times for each leg: 1. Lie on your back on a firm bed or the floor with your legs extended. 2. Bring one knee to your chest. Your other leg should stay extended and in contact with the floor. 3. Hold your knee in place by grabbing your knee or thigh with both hands and hold. 4. Pull on your knee until you feel a gentle stretch in your lower back or buttocks. 5. Hold the stretch for 10-30 seconds. 6. Slowly release and straighten your leg. Pelvic tilt Repeat these steps 5-10 times: 1. Lie on your back on a firm bed or the floor with your legs extended. 2. Bend your knees so they are pointing toward the ceiling and your feet are flat on the floor. 3. Tighten your lower abdominal muscles to press your lower back against the floor. This motion will  tilt your pelvis so your tailbone points up toward the ceiling instead of pointing to your feet or the floor. 4. With gentle tension and even breathing, hold this position for 5-10 seconds. Cat-cow Repeat these steps until your lower back becomes more flexible: 1. Get into a hands-and-knees position on a firm surface. Keep your hands under your shoulders, and keep your knees under your hips. You may place padding under your knees for comfort. 2. Let your head hang down toward your chest. Contract your abdominal muscles and point your tailbone toward the floor so your lower back becomes rounded like the back of a cat. 3. Hold this position for 5 seconds. 4. Slowly lift your head, let your abdominal muscles relax and point your tailbone up toward the ceiling so your back forms a sagging arch like the back of a cow. 5. Hold this position for 5 seconds.  Press-ups Repeat these steps 5-10 times: 1. Lie on your abdomen (face-down) on the floor. 2. Place your palms near your head, about  shoulder-width apart. 3. Keeping your back as relaxed as possible and keeping your hips on the floor, slowly straighten your arms to raise the top half of your body and lift your shoulders. Do not use your back muscles to raise your upper torso. You may adjust the placement of your hands to make yourself more comfortable. 4. Hold this position for 5 seconds while you keep your back relaxed. 5. Slowly return to lying flat on the floor.  Bridges Repeat these steps 10 times: 1. Lie on your back on a firm surface. 2. Bend your knees so they are pointing toward the ceiling and your feet are flat on the floor. Your arms should be flat at your sides, next to your body. 3. Tighten your buttocks muscles and lift your buttocks off the floor until your waist is at almost the same height as your knees. You should feel the muscles working in your buttocks and the back of your thighs. If you do not feel these muscles, slide your feet 1-2 inches farther away from your buttocks. 4. Hold this position for 3-5 seconds. 5. Slowly lower your hips to the starting position, and allow your buttocks muscles to relax completely. If this exercise is too easy, try doing it with your arms crossed over your chest. Abdominal crunches Repeat these steps 5-10 times: 1. Lie on your back on a firm bed or the floor with your legs extended. 2. Bend your knees so they are pointing toward the ceiling and your feet are flat on the floor. 3. Cross your arms over your chest. 4. Tip your chin slightly toward your chest without bending your neck. 5. Tighten your abdominal muscles and slowly raise your trunk (torso) high enough to lift your shoulder blades a tiny bit off the floor. Avoid raising your torso higher than that because it can put too much stress on your low back and does not help to strengthen your abdominal muscles. 6. Slowly return to your starting position. Back lifts Repeat these steps 5-10 times: 1. Lie on your abdomen  (face-down) with your arms at your sides, and rest your forehead on the floor. 2. Tighten the muscles in your legs and your buttocks. 3. Slowly lift your chest off the floor while you keep your hips pressed to the floor. Keep the back of your head in line with the curve in your back. Your eyes should be looking at the floor. 4. Hold this position for  3-5 seconds. 5. Slowly return to your starting position. Contact a health care provider if:  Your back pain or discomfort gets much worse when you do an exercise.  Your worsening back pain or discomfort does not lessen within 2 hours after you exercise. If you have any of these problems, stop doing these exercises right away. Do not do them again unless your health care provider says that you can. Get help right away if:  You develop sudden, severe back pain. If this happens, stop doing the exercises right away. Do not do them again unless your health care provider says that you can. This information is not intended to replace advice given to you by your health care provider. Make sure you discuss any questions you have with your health care provider. Document Revised: 02/17/2019 Document Reviewed: 07/15/2018 Elsevier Patient Education  [place].

## 2020-09-14 ENCOUNTER — Ambulatory Visit: Payer: Self-pay | Admitting: Rheumatology

## 2021-01-08 ENCOUNTER — Other Ambulatory Visit: Payer: Self-pay

## 2021-01-08 ENCOUNTER — Encounter: Payer: Self-pay | Admitting: Cardiology

## 2021-01-08 ENCOUNTER — Ambulatory Visit (INDEPENDENT_AMBULATORY_CARE_PROVIDER_SITE_OTHER): Payer: No Typology Code available for payment source | Admitting: Cardiology

## 2021-01-08 VITALS — BP 120/80 | HR 66 | Ht 71.0 in | Wt 190.0 lb

## 2021-01-08 DIAGNOSIS — I10 Essential (primary) hypertension: Secondary | ICD-10-CM

## 2021-01-08 DIAGNOSIS — Z8249 Family history of ischemic heart disease and other diseases of the circulatory system: Secondary | ICD-10-CM | POA: Diagnosis not present

## 2021-01-08 NOTE — Patient Instructions (Signed)
Medication Instructions:  The current medical regimen is effective;  continue present plan and medications.  *If you need a refill on your cardiac medications before your next appointment, please call your pharmacy*  Testing/Procedures: Your physician has requested that you have Coronary Calcium score which is completed by CT. Cardiac computed tomography (CT) is a painless test that uses an x-ray machine to take clear, detailed pictures of your heart.  This testing is done here at our office and the charge is $99 due at the time of the test.  There are no prior instruction.  Follow-Up: At [facility], you and your health needs are our priority.  As part of our continuing mission to provide you with exceptional heart care, we have created designated Provider Care Teams.  These Care Teams include your primary Cardiologist (physician) and Advanced Practice Providers (APPs -  Physician Assistants and Nurse Practitioners) who all work together to provide you with the care you need, when you need it.  We recommend signing up for the patient portal called "MyChart".  Sign up information is provided on this After Visit Summary.  MyChart is used to connect with patients for Virtual Visits (Telemedicine).  Patients are able to view lab/test results, encounter notes, upcoming appointments, etc.  Non-urgent messages can be sent to your provider as well.   To learn more about what you can do with MyChart, go to [email or web address].    Your next appointment:   12 month(s)  The format for your next appointment:   In Person  Provider:   Candee Furbish, MD  Thank you for choosing [facility]!!

## 2021-01-08 NOTE — Progress Notes (Signed)
Cardiology Office Note:    Date:  01/09/2021   ID:  Andrew Cunningham, DOB [birth year], MRN [id number]  PCP:  [name], MD   [place]  Cardiologist:  Candee Furbish, MD  Advanced Practice Provider:  No care team member to display Electrophysiologist:  None       Referring MD: [name], MD    History of Present Illness:    Andrew Cunningham is a 50 y.o. male here for the evaluation of family history of coronary artery disease at the request of Dr. Moreen Fowler.  In review of his office note from 11/09/2020 he has a family history of hypertension in both parents.  He has been taking losartan without any difficulties.  Also has hyperlipidemia, LDL 117 in 2020.  Taking statin without difficulty.  Anxiety-rarely uses propranolol prior to public speaking.  His wife was diagnosed with stage IV lung cancer in 2017.  His paternal grandfather had coronary artery disease, 6 MI's, died in his 59's. He did not smoke.   He is a former smoker quit in the year 2010.  Friends, co workers with MI.  Prompted him to get further evaluation.   Mild ETOH, eats healthy.   No CP, no SOB, no fevers chills nausea vomiting syncope  Overall he has been feeling quite well without any fevers chills nausea vomiting syncope bleeding.  Post COVID August 2021.  Past Medical History:  Diagnosis Date  . Anxiety    per patient   . Cervical spine arthritis   . COVID-19   . ED (erectile dysfunction)   . GERD (gastroesophageal reflux disease)    per patient   . Hyperlipemia    per patient   . Hypertension    per patient   . Situational stress     Past Surgical History:  Procedure Laterality Date  . APPENDECTOMY  2005  . HERNIA REPAIR  1992    Current Medications: Current Meds  Medication Sig  . ALPRAZolam (XANAX) 0.5 MG tablet Take 0.5 mg by mouth daily as needed.  . cetirizine (ZYRTEC) 10 MG tablet Take 10 mg by mouth daily.  Marland Kitchen losartan (COZAAR) 50 MG tablet Take 50 mg by  mouth daily.  Marland Kitchen omeprazole (PRILOSEC) 10 MG capsule Take 10 mg by mouth daily.  . propranolol (INDERAL) 20 MG tablet Take 20 mg by mouth as needed.  . rosuvastatin (CRESTOR) 5 MG tablet Take 5 mg by mouth daily.     Allergies:   Patient has no known allergies.   Social History   Socioeconomic History  . Marital status: Married    Spouse name: Not on file  . Number of children: Not on file  . Years of education: Not on file  . Highest education level: Not on file  Occupational History  . Not on file  Tobacco Use  . Smoking status: Former Research scientist (life sciences)  . Smokeless tobacco: Former Systems developer    Types: Secondary school teacher  . Vaping Use: Never used  Substance and Sexual Activity  . Alcohol use: Yes    Comment: 2 drinks daily   . Drug use: Not Currently  . Sexual activity: Not on file  Other Topics Concern  . Not on file  Social History Narrative  . Not on file   Social Determinants of Health   Financial Resource Strain: Not on file  Food Insecurity: Not on file  Transportation Needs: Not on file  Physical Activity: Not on file  Stress: Not  on file  Social Connections: Not on file     Family History: The patient's family history includes Arthritis in his mother; Healthy in his daughter, father, mother, and sister.  Coronary artery disease in grandparents  ROS:   Please see the history of present illness.     All other systems reviewed and are negative.  EKGs/Labs/Other Studies Reviewed:    The following studies were reviewed today: Referring office notes reviewed, lab work reviewed  EKG:  EKG is  ordered today.  The ekg ordered today demonstrates sinus rhythm 66 otherwise normal  Recent Labs: No results found for requested labs within last 8760 hours.  Recent Lipid Panel No results found for: CHOL, TRIG, HDL, CHOLHDL, VLDL, LDLCALC, LDLDIRECT  Total cholesterol 190, HDL 75, LDL 106, triglycerides 51, hemoglobin 15.6, creatinine 0.9, potassium 4.7, ALT 19, TSH 1.4 from  outside labs.  10/30/2020.  Risk Assessment/Calculations:      Physical Exam:    VS:  BP 120/80 (BP Location: Left Arm, Patient Position: Sitting, Cuff Size: Normal)   Pulse 66   Ht 5\' 11"  (1.803 m)   Wt 190 lb (86.2 kg)   SpO2 98%   BMI 26.50 kg/m     Wt Readings from Last 3 Encounters:  01/08/21 190 lb (86.2 kg)  08/22/20 187 lb (84.8 kg)  10/07/16 180 lb (81.6 kg)     GEN:  Well nourished, well developed in no acute distress HEENT: Normal NECK: No JVD; No carotid bruits LYMPHATICS: No lymphadenopathy CARDIAC: RRR, no murmurs, rubs, gallops RESPIRATORY:  Clear to auscultation without rales, wheezing or rhonchi  ABDOMEN: Soft, non-tender, non-distended MUSCULOSKELETAL:  No edema; No deformity  SKIN: Warm and dry NEUROLOGIC:  Alert and oriented x 3 PSYCHIATRIC:  Normal affect   ASSESSMENT:    1. Family history of early CAD   2. Essential hypertension    PLAN:    In order of problems listed above:  Family history of coronary artery disease -We will check a coronary calcium score.  $99.  He is an excellent candidate for this given his family history with his grandfather having heart attacks in his 20s.  If remarkable, will likely increase dose of rosuvastatin 5 mg that he is currently taking.  Hyperlipidemia -Currently on rosuvastatin 5 mg.  LDL 106 currently.  ALT 19.  Essential hypertension -Doing well on losartan.  No side effects. -Continue to promote healthy diet and exercise.  Alcohol in moderation.    1 year follow-up for prevention.  Medication Adjustments/Labs and Tests Ordered: Current medicines are reviewed at length with the patient today.  Concerns regarding medicines are outlined above.  Orders Placed This Encounter  Procedures  . CT CARDIAC SCORING (SELF PAY ONLY)  . EKG 12-Lead   No orders of the defined types were placed in this encounter.   Patient Instructions  Medication Instructions:  The current medical regimen is effective;   continue present plan and medications.  *If you need a refill on your cardiac medications before your next appointment, please call your pharmacy*  Testing/Procedures: Your physician has requested that you have Coronary Calcium score which is completed by CT. Cardiac computed tomography (CT) is a painless test that uses an x-ray machine to take clear, detailed pictures of your heart.  This testing is done here at our office and the charge is $99 due at the time of the test.  There are no prior instruction.  Follow-Up: At [facility], you and your health needs are our  priority.  As part of our continuing mission to provide you with exceptional heart care, we have created designated Provider Care Teams.  These Care Teams include your primary Cardiologist (physician) and Advanced Practice Providers (APPs -  Physician Assistants and Nurse Practitioners) who all work together to provide you with the care you need, when you need it.  We recommend signing up for the patient portal called "MyChart".  Sign up information is provided on this After Visit Summary.  MyChart is used to connect with patients for Virtual Visits (Telemedicine).  Patients are able to view lab/test results, encounter notes, upcoming appointments, etc.  Non-urgent messages can be sent to your provider as well.   To learn more about what you can do with MyChart, go to [email or web address].    Your next appointment:   12 month(s)  The format for your next appointment:   In Person  Provider:   Candee Furbish, MD  Thank you for choosing [facility]!!        Signed, Candee Furbish, MD  01/09/2021 10:15 AM    [place]

## 2021-02-05 ENCOUNTER — Other Ambulatory Visit: Payer: Self-pay

## 2021-02-05 ENCOUNTER — Inpatient Hospital Stay: Admission: RE | Admit: 2021-02-05 | Payer: Self-pay | Source: Ambulatory Visit

## 2021-02-05 ENCOUNTER — Ambulatory Visit (INDEPENDENT_AMBULATORY_CARE_PROVIDER_SITE_OTHER)
Admission: RE | Admit: 2021-02-05 | Discharge: 2021-02-05 | Disposition: A | Discharge status: Home / Self Care | Payer: Self-pay | Source: Ambulatory Visit | Attending: Cardiology | Admitting: Cardiology

## 2021-02-05 DIAGNOSIS — Z8249 Family history of ischemic heart disease and other diseases of the circulatory system: Secondary | ICD-10-CM

## 2021-02-05 DIAGNOSIS — I10 Essential (primary) hypertension: Secondary | ICD-10-CM

## 2021-09-04 ENCOUNTER — Other Ambulatory Visit (HOSPITAL_BASED_OUTPATIENT_CLINIC_OR_DEPARTMENT_OTHER): Payer: Self-pay

## 2021-09-04 MED ORDER — INFLUENZA VAC SPLIT QUAD 0.5 ML IM SUSY
PREFILLED_SYRINGE | INTRAMUSCULAR | 0 refills | Status: AC
  Filled 2021-09-04: qty 0.5, 1d supply, fill #0

## 2022-09-17 ENCOUNTER — Ambulatory Visit (INDEPENDENT_AMBULATORY_CARE_PROVIDER_SITE_OTHER): Payer: No Typology Code available for payment source

## 2022-09-17 ENCOUNTER — Ambulatory Visit (INDEPENDENT_AMBULATORY_CARE_PROVIDER_SITE_OTHER): Payer: No Typology Code available for payment source | Admitting: Orthopaedic Surgery

## 2022-09-17 ENCOUNTER — Encounter (HOSPITAL_BASED_OUTPATIENT_CLINIC_OR_DEPARTMENT_OTHER): Payer: Self-pay | Admitting: Orthopaedic Surgery

## 2022-09-17 DIAGNOSIS — M25511 Pain in right shoulder: Secondary | ICD-10-CM

## 2022-09-17 DIAGNOSIS — G8929 Other chronic pain: Secondary | ICD-10-CM

## 2022-09-17 NOTE — Progress Notes (Signed)
Chief Complaint: Right shoulder pain     History of Present Illness:    Andrew Cunningham is a 51 y.o. male presents today with right shoulder pain which has been ongoing for 6 months.  He states that he has been experiencing this pain after he had a forehead ball and tennis.  Since that time he has had pain about the lateral and posterior aspect of the shoulder.  This has not resolved with over 6 weeks of physical therapy at this time.  This is his dominant arm.  He works in IT trainer.  He is taking Advil as needed.  He has been resting it and attempting to modify his activities without any relief.  He was seen at [facility].    Surgical History:   None  PMH/PSH/Family History/Social History/Meds/Allergies:    Past Medical History:  Diagnosis Date   Anxiety    per patient    Cervical spine arthritis    COVID-19    ED (erectile dysfunction)    GERD (gastroesophageal reflux disease)    per patient    Hyperlipemia    per patient    Hypertension    per patient    Situational stress    Past Surgical History:  Procedure Laterality Date   APPENDECTOMY  2005   HERNIA REPAIR  1992   Social History   Socioeconomic History   Marital status: Married    Spouse name: Not on file   Number of children: Not on file   Years of education: Not on file   Highest education level: Not on file  Occupational History   Not on file  Tobacco Use   Smoking status: Former   Smokeless tobacco: Former    Types: Nurse, children's Use: Never used  Substance and Sexual Activity   Alcohol use: Yes    Comment: 2 drinks daily    Drug use: Not Currently   Sexual activity: Not on file  Other Topics Concern   Not on file  Social History Narrative   Not on file   Social Determinants of Health   Financial Resource Strain: Not on file  Food Insecurity: Not on file  Transportation Needs: Not on file  Physical Activity: Not on  file  Stress: Not on file  Social Connections: Not on file   Family History  Problem Relation Age of Onset   Healthy Mother    Arthritis Mother    Healthy Father    Healthy Sister    Healthy Daughter    No Known Allergies Current Outpatient Medications  Medication Sig Dispense Refill   ALPRAZolam (XANAX) 0.5 MG tablet Take 0.5 mg by mouth daily as needed.     cetirizine (ZYRTEC) 10 MG tablet Take 10 mg by mouth daily.     influenza vac split quadrivalent PF (FLUARIX) 0.5 ML injection Inject into the muscle. 0.5 mL 0   losartan (COZAAR) 50 MG tablet Take 50 mg by mouth daily.     omeprazole (PRILOSEC) 10 MG capsule Take 10 mg by mouth daily.     propranolol (INDERAL) 20 MG tablet Take 20 mg by mouth as needed.     rosuvastatin (CRESTOR) 5 MG tablet Take 5 mg by mouth daily.     No current facility-administered medications for  this visit.   No results found.  Review of Systems:   A ROS was performed including pertinent positives and negatives as documented in the HPI.  Physical Exam :   Constitutional: NAD and appears stated age Neurological: Alert and oriented Psych: Appropriate affect and cooperative There were no vitals taken for this visit.   Comprehensive Musculoskeletal Exam:    Musculoskeletal Exam    Inspection Right Left  Skin No atrophy or winging No atrophy or winging  Palpation    Tenderness Lateral shoulder None  Range of Motion    Flexion (passive) 170 170  Flexion (active) 170 170  Abduction 170 170  ER at the side 70 70  Can reach behind back to T12 T12  Strength     Weakness with external rotation and Hornblower none  Special Tests    Pseudoparalytic No No  Neurologic    Fires PIN, radial, median, ulnar, musculocutaneous, axillary, suprascapular, long thoracic, and spinal accessory innervated muscles. No abnormal sensibility  Vascular/Lymphatic    Radial Pulse 2+ 2+  Cervical Exam    Patient has symmetric cervical range of motion with negative  Spurling's test.  Special Test: Negative belly press     Imaging:   Xray (three-view right shoulder): There is a focus of calcium superior to the glenoid consistent with possible retracted tear versus calcified labrum    I personally reviewed and interpreted the radiographs.   Assessment:   51 y.o. male right-hand-dominant male presents with right shoulder pain and weakness now ongoing for 6 months.  At this time he has tried activity restriction and activity modification.  He has done physical therapy for strengthening of the shoulder.  He is trialed anti-inflammatories.  Given the fact that he is failed all of these modalities I do believe that an MRI is indicated in order to rule out an underlying retraction of this focus of calcific tendinitis.  Plan :    -Return to clinic following MRI right shoulder   I personally saw and evaluated the patient, and participated in the management and treatment plan.  Vanetta Mulders, MD Attending Physician, Orthopedic Surgery  This document was dictated using Dragon voice recognition software. A reasonable attempt at proof reading has been made to minimize errors.

## 2022-09-23 ENCOUNTER — Telehealth: Payer: Self-pay | Admitting: Orthopaedic Surgery

## 2022-09-23 NOTE — Telephone Encounter (Signed)
Spoke to pt directly and explained MRI is still pending insurance authorization. No further questions  asked

## 2022-09-23 NOTE — Telephone Encounter (Signed)
Pt called in stating that he haven't heard from anyone yet about his MRI being scheduled... Pt was wondering what facility was he referred to for the MRI... Pt requesting callback.Marland KitchenMarland Kitchen

## 2022-10-12 ENCOUNTER — Ambulatory Visit
Admission: RE | Admit: 2022-10-12 | Discharge: 2022-10-12 | Disposition: A | Discharge status: Home / Self Care | Payer: No Typology Code available for payment source | Source: Ambulatory Visit | Attending: Orthopaedic Surgery | Admitting: Orthopaedic Surgery

## 2022-10-12 DIAGNOSIS — G8929 Other chronic pain: Secondary | ICD-10-CM

## 2022-10-15 ENCOUNTER — Ambulatory Visit (INDEPENDENT_AMBULATORY_CARE_PROVIDER_SITE_OTHER): Payer: No Typology Code available for payment source | Admitting: Orthopaedic Surgery

## 2022-10-15 ENCOUNTER — Other Ambulatory Visit (HOSPITAL_BASED_OUTPATIENT_CLINIC_OR_DEPARTMENT_OTHER): Payer: Self-pay | Admitting: Orthopaedic Surgery

## 2022-10-15 DIAGNOSIS — S43431A Superior glenoid labrum lesion of right shoulder, initial encounter: Secondary | ICD-10-CM

## 2022-10-15 NOTE — H&P (View-Only) (Signed)
Chief Complaint: Right shoulder pain     History of Present Illness:   10/15/2022: Presents today for follow-up of his right shoulder MRI.  At this time he is continuing to have somewhat of a painful shoulder which is now radiating to the upper neck.  He tried to recently play golf and this was very limited following with resulting shoulder pain.  He is here today for further discussion.  Andrew Cunningham is a 51 y.o. male presents today with right shoulder pain which has been ongoing for 6 months.  He states that he has been experiencing this pain after he had a forehead ball and tennis.  Since that time he has had pain about the lateral and posterior aspect of the shoulder.  This has not resolved with over 6 weeks of physical therapy at this time.  This is his dominant arm.  He works in Visual merchandiser.  He is taking Advil as needed.  He has been resting it and attempting to modify his activities without any relief.  He was seen at [facility].    Surgical History:   None  PMH/PSH/Family History/Social History/Meds/Allergies:    Past Medical History:  Diagnosis Date   Anxiety    per patient    Cervical spine arthritis    COVID-19    ED (erectile dysfunction)    GERD (gastroesophageal reflux disease)    per patient    Hyperlipemia    per patient    Hypertension    per patient    Situational stress    Past Surgical History:  Procedure Laterality Date   APPENDECTOMY  2005   HERNIA REPAIR  1992   Social History   Socioeconomic History   Marital status: Married    Spouse name: Not on file   Number of children: Not on file   Years of education: Not on file   Highest education level: Not on file  Occupational History   Not on file  Tobacco Use   Smoking status: Former   Smokeless tobacco: Former    Types: Associate Professor Use: Never used  Substance and Sexual Activity   Alcohol use: Yes    Comment: 2 drinks  daily    Drug use: Not Currently   Sexual activity: Not on file  Other Topics Concern   Not on file  Social History Narrative   Not on file   Social Determinants of Health   Financial Resource Strain: Not on file  Food Insecurity: Not on file  Transportation Needs: Not on file  Physical Activity: Not on file  Stress: Not on file  Social Connections: Not on file   Family History  Problem Relation Age of Onset   Healthy Mother    Arthritis Mother    Healthy Father    Healthy Sister    Healthy Daughter    No Known Allergies Current Outpatient Medications  Medication Sig Dispense Refill   ALPRAZolam (XANAX) 0.5 MG tablet Take 0.5 mg by mouth daily as needed.     cetirizine (ZYRTEC) 10 MG tablet Take 10 mg by mouth daily.     influenza vac split quadrivalent PF (FLUARIX) 0.5 ML injection Inject into the muscle. 0.5 mL 0   losartan (COZAAR) 50 MG tablet Take 50 mg by  mouth daily.     omeprazole (PRILOSEC) 10 MG capsule Take 10 mg by mouth daily.     propranolol (INDERAL) 20 MG tablet Take 20 mg by mouth as needed.     rosuvastatin (CRESTOR) 5 MG tablet Take 5 mg by mouth daily.     No current facility-administered medications for this visit.   No results found.  Review of Systems:   A ROS was performed including pertinent positives and negatives as documented in the HPI.  Physical Exam :   Constitutional: NAD and appears stated age Neurological: Alert and oriented Psych: Appropriate affect and cooperative There were no vitals taken for this visit.   Comprehensive Musculoskeletal Exam:    Musculoskeletal Exam    Inspection Right Left  Skin No atrophy or winging No atrophy or winging  Palpation    Tenderness Lateral shoulder, glenohumeral None  Range of Motion    Flexion (passive) 170 170  Flexion (active) 170 170  Abduction 170 170  ER at the side 70 70  Can reach behind back to T12 T12  Strength     Weakness with external rotation and Hornblower none  Special  Tests    Pseudoparalytic No No  Neurologic    Fires PIN, radial, median, ulnar, musculocutaneous, axillary, suprascapular, long thoracic, and spinal accessory innervated muscles. No abnormal sensibility  Vascular/Lymphatic    Radial Pulse 2+ 2+  Cervical Exam    Patient has symmetric cervical range of motion with negative Spurling's test.  Special Test: Negative belly press     Imaging:   Xray (three-view right shoulder): There is a focus of calcium superior consistent with labral tearing   MRI right shoulder: Anterior as well as posterior shoulder labral tear with a large posterior glenohumeral cyst and what appears to be a loose body in the anterior aspect of the shoulder  I personally reviewed and interpreted the radiographs.   Assessment:   51 y.o. male right-hand-dominant male presents with right shoulder pain and weakness now ongoing for 6 months.  At this time he has tried activity restriction and activity modification.  He has done physical therapy for strengthening of the shoulder.  He is trialed anti-inflammatories.  I did discuss that his exam is consistent with a labral tear with an intra-articular loose body.  Given the fact that there is a loose body we discussed the role for arthroscopic intervention and debridement with labral repair as needed.  I discussed the rehab associated with this.  I did also discuss an additional option would be to try steroid injection particularly as he has an upcoming trip to [place] for which she will be doing significant golfing.  I advised that this is also a possibility.  He would like to consider his options and he will call us back to let us know  Plan :    -He will call us back if interested in right shoulder injection versus right shoulder arthroscopy with labral repair and loose body removal   I personally saw and evaluated the patient, and participated in the management and treatment plan.  Huel Cote, MD Attending  Physician, Orthopedic Surgery  This document was dictated using Dragon voice recognition software. A reasonable attempt at proof reading has been made to minimize errors.

## 2022-10-15 NOTE — Progress Notes (Signed)
Chief Complaint: Right shoulder pain     History of Present Illness:   10/15/2022: Presents today for follow-up of his right shoulder MRI.  At this time he is continuing to have somewhat of a painful shoulder which is now radiating to the upper neck.  He tried to recently play golf and this was very limited following with resulting shoulder pain.  He is here today for further discussion.  Andrew Cunningham is a 51 y.o. male presents today with right shoulder pain which has been ongoing for 6 months.  He states that he has been experiencing this pain after he had a forehead ball and tennis.  Since that time he has had pain about the lateral and posterior aspect of the shoulder.  This has not resolved with over 6 weeks of physical therapy at this time.  This is his dominant arm.  He works in IT trainer.  He is taking Advil as needed.  He has been resting it and attempting to modify his activities without any relief.  He was seen at [facility].    Surgical History:   None  PMH/PSH/Family History/Social History/Meds/Allergies:    Past Medical History:  Diagnosis Date   Anxiety    per patient    Cervical spine arthritis    COVID-19    ED (erectile dysfunction)    GERD (gastroesophageal reflux disease)    per patient    Hyperlipemia    per patient    Hypertension    per patient    Situational stress    Past Surgical History:  Procedure Laterality Date   APPENDECTOMY  2005   HERNIA REPAIR  1992   Social History   Socioeconomic History   Marital status: Married    Spouse name: Not on file   Number of children: Not on file   Years of education: Not on file   Highest education level: Not on file  Occupational History   Not on file  Tobacco Use   Smoking status: Former   Smokeless tobacco: Former    Types: Nurse, children's Use: Never used  Substance and Sexual Activity   Alcohol use: Yes    Comment: 2 drinks  daily    Drug use: Not Currently   Sexual activity: Not on file  Other Topics Concern   Not on file  Social History Narrative   Not on file   Social Determinants of Health   Financial Resource Strain: Not on file  Food Insecurity: Not on file  Transportation Needs: Not on file  Physical Activity: Not on file  Stress: Not on file  Social Connections: Not on file   Family History  Problem Relation Age of Onset   Healthy Mother    Arthritis Mother    Healthy Father    Healthy Sister    Healthy Daughter    No Known Allergies Current Outpatient Medications  Medication Sig Dispense Refill   ALPRAZolam (XANAX) 0.5 MG tablet Take 0.5 mg by mouth daily as needed.     cetirizine (ZYRTEC) 10 MG tablet Take 10 mg by mouth daily.     influenza vac split quadrivalent PF (FLUARIX) 0.5 ML injection Inject into the muscle. 0.5 mL 0   losartan (COZAAR) 50 MG tablet Take 50 mg by  mouth daily.     omeprazole (PRILOSEC) 10 MG capsule Take 10 mg by mouth daily.     propranolol (INDERAL) 20 MG tablet Take 20 mg by mouth as needed.     rosuvastatin (CRESTOR) 5 MG tablet Take 5 mg by mouth daily.     No current facility-administered medications for this visit.   No results found.  Review of Systems:   A ROS was performed including pertinent positives and negatives as documented in the HPI.  Physical Exam :   Constitutional: NAD and appears stated age Neurological: Alert and oriented Psych: Appropriate affect and cooperative There were no vitals taken for this visit.   Comprehensive Musculoskeletal Exam:    Musculoskeletal Exam    Inspection Right Left  Skin No atrophy or winging No atrophy or winging  Palpation    Tenderness Lateral shoulder, glenohumeral None  Range of Motion    Flexion (passive) 170 170  Flexion (active) 170 170  Abduction 170 170  ER at the side 70 70  Can reach behind back to T12 T12  Strength     Weakness with external rotation and Hornblower none  Special  Tests    Pseudoparalytic No No  Neurologic    Fires PIN, radial, median, ulnar, musculocutaneous, axillary, suprascapular, long thoracic, and spinal accessory innervated muscles. No abnormal sensibility  Vascular/Lymphatic    Radial Pulse 2+ 2+  Cervical Exam    Patient has symmetric cervical range of motion with negative Spurling's test.  Special Test: Negative belly press     Imaging:   Xray (three-view right shoulder): There is a focus of calcium superior consistent with labral tearing   MRI right shoulder: Anterior as well as posterior shoulder labral tear with a large posterior glenohumeral cyst and what appears to be a loose body in the anterior aspect of the shoulder  I personally reviewed and interpreted the radiographs.   Assessment:   51 y.o. male right-hand-dominant male presents with right shoulder pain and weakness now ongoing for 6 months.  At this time he has tried activity restriction and activity modification.  He has done physical therapy for strengthening of the shoulder.  He is trialed anti-inflammatories.  I did discuss that his exam is consistent with a labral tear with an intra-articular loose body.  Given the fact that there is a loose body we discussed the role for arthroscopic intervention and debridement with labral repair as needed.  I discussed the rehab associated with this.  I did also discuss an additional option would be to try steroid injection particularly as he has an upcoming trip to [place] for which she will be doing significant golfing.  I advised that this is also a possibility.  He would like to consider his options and he will call us back to let us know  Plan :    -He will call us back if interested in right shoulder injection versus right shoulder arthroscopy with labral repair and loose body removal   I personally saw and evaluated the patient, and participated in the management and treatment plan.  Vanetta Mulders, MD Attending  Physician, Orthopedic Surgery  This document was dictated using Dragon voice recognition software. A reasonable attempt at proof reading has been made to minimize errors.

## 2022-11-04 ENCOUNTER — Encounter (HOSPITAL_BASED_OUTPATIENT_CLINIC_OR_DEPARTMENT_OTHER): Payer: Self-pay | Admitting: Orthopaedic Surgery

## 2022-11-04 ENCOUNTER — Other Ambulatory Visit: Payer: Self-pay

## 2022-11-05 ENCOUNTER — Encounter (HOSPITAL_BASED_OUTPATIENT_CLINIC_OR_DEPARTMENT_OTHER)
Admission: RE | Admit: 2022-11-05 | Discharge: 2022-11-05 | Disposition: A | Discharge status: Home / Self Care | Payer: No Typology Code available for payment source | Source: Ambulatory Visit | Attending: Orthopaedic Surgery | Admitting: Orthopaedic Surgery

## 2022-11-05 DIAGNOSIS — E785 Hyperlipidemia, unspecified: Secondary | ICD-10-CM | POA: Diagnosis not present

## 2022-11-05 DIAGNOSIS — M1990 Unspecified osteoarthritis, unspecified site: Secondary | ICD-10-CM | POA: Diagnosis not present

## 2022-11-05 DIAGNOSIS — K219 Gastro-esophageal reflux disease without esophagitis: Secondary | ICD-10-CM | POA: Diagnosis not present

## 2022-11-05 DIAGNOSIS — F419 Anxiety disorder, unspecified: Secondary | ICD-10-CM | POA: Diagnosis not present

## 2022-11-05 DIAGNOSIS — X58XXXA Exposure to other specified factors, initial encounter: Secondary | ICD-10-CM | POA: Diagnosis not present

## 2022-11-05 DIAGNOSIS — Z87891 Personal history of nicotine dependence: Secondary | ICD-10-CM | POA: Diagnosis not present

## 2022-11-05 DIAGNOSIS — I10 Essential (primary) hypertension: Secondary | ICD-10-CM | POA: Diagnosis not present

## 2022-11-05 DIAGNOSIS — Z79899 Other long term (current) drug therapy: Secondary | ICD-10-CM | POA: Diagnosis not present

## 2022-11-05 DIAGNOSIS — S43491A Other sprain of right shoulder joint, initial encounter: Secondary | ICD-10-CM | POA: Diagnosis not present

## 2022-11-10 ENCOUNTER — Ambulatory Visit (HOSPITAL_BASED_OUTPATIENT_CLINIC_OR_DEPARTMENT_OTHER): Payer: Self-pay | Admitting: Orthopaedic Surgery

## 2022-11-10 DIAGNOSIS — S43431A Superior glenoid labrum lesion of right shoulder, initial encounter: Secondary | ICD-10-CM

## 2022-11-11 ENCOUNTER — Ambulatory Visit (HOSPITAL_BASED_OUTPATIENT_CLINIC_OR_DEPARTMENT_OTHER)
Admission: RE | Admit: 2022-11-11 | Discharge: 2022-11-11 | Disposition: A | Discharge status: Home / Self Care | Payer: No Typology Code available for payment source | Attending: Orthopaedic Surgery | Admitting: Orthopaedic Surgery

## 2022-11-11 ENCOUNTER — Ambulatory Visit (HOSPITAL_BASED_OUTPATIENT_CLINIC_OR_DEPARTMENT_OTHER): Payer: No Typology Code available for payment source | Admitting: Anesthesiology

## 2022-11-11 ENCOUNTER — Other Ambulatory Visit: Payer: Self-pay

## 2022-11-11 ENCOUNTER — Encounter (HOSPITAL_BASED_OUTPATIENT_CLINIC_OR_DEPARTMENT_OTHER)
Admission: RE | Disposition: A | Discharge status: Home / Self Care | Payer: Self-pay | Source: Home / Self Care | Attending: Orthopaedic Surgery

## 2022-11-11 ENCOUNTER — Encounter (HOSPITAL_BASED_OUTPATIENT_CLINIC_OR_DEPARTMENT_OTHER): Payer: Self-pay | Admitting: Orthopaedic Surgery

## 2022-11-11 DIAGNOSIS — I10 Essential (primary) hypertension: Secondary | ICD-10-CM | POA: Insufficient documentation

## 2022-11-11 DIAGNOSIS — S43431A Superior glenoid labrum lesion of right shoulder, initial encounter: Secondary | ICD-10-CM | POA: Diagnosis not present

## 2022-11-11 DIAGNOSIS — F419 Anxiety disorder, unspecified: Secondary | ICD-10-CM | POA: Insufficient documentation

## 2022-11-11 DIAGNOSIS — Z87891 Personal history of nicotine dependence: Secondary | ICD-10-CM | POA: Insufficient documentation

## 2022-11-11 DIAGNOSIS — E785 Hyperlipidemia, unspecified: Secondary | ICD-10-CM | POA: Insufficient documentation

## 2022-11-11 DIAGNOSIS — S43431D Superior glenoid labrum lesion of right shoulder, subsequent encounter: Secondary | ICD-10-CM | POA: Diagnosis not present

## 2022-11-11 DIAGNOSIS — S43491A Other sprain of right shoulder joint, initial encounter: Secondary | ICD-10-CM | POA: Insufficient documentation

## 2022-11-11 DIAGNOSIS — M24011 Loose body in right shoulder: Secondary | ICD-10-CM

## 2022-11-11 DIAGNOSIS — X58XXXA Exposure to other specified factors, initial encounter: Secondary | ICD-10-CM | POA: Insufficient documentation

## 2022-11-11 DIAGNOSIS — Z01818 Encounter for other preprocedural examination: Secondary | ICD-10-CM

## 2022-11-11 DIAGNOSIS — K219 Gastro-esophageal reflux disease without esophagitis: Secondary | ICD-10-CM | POA: Insufficient documentation

## 2022-11-11 DIAGNOSIS — Z79899 Other long term (current) drug therapy: Secondary | ICD-10-CM | POA: Insufficient documentation

## 2022-11-11 DIAGNOSIS — M1990 Unspecified osteoarthritis, unspecified site: Secondary | ICD-10-CM | POA: Insufficient documentation

## 2022-11-11 HISTORY — PX: SHOULDER ARTHROSCOPY WITH LABRAL REPAIR: SHX5691

## 2022-11-11 HISTORY — DX: Other specified postprocedural states: Z98.890

## 2022-11-11 HISTORY — DX: Nausea with vomiting, unspecified: R11.2

## 2022-11-11 SURGERY — ARTHROSCOPY, SHOULDER, WITH GLENOID LABRUM REPAIR
Anesthesia: Regional | Site: Shoulder | Laterality: Right

## 2022-11-11 MED ORDER — ONDANSETRON HCL 4 MG/2ML IJ SOLN
INTRAMUSCULAR | Status: DC | PRN
  Administered 2022-11-11: 4 mg via INTRAVENOUS

## 2022-11-11 MED ORDER — FENTANYL CITRATE (PF) 100 MCG/2ML IJ SOLN
100.0000 ug | Freq: Once | INTRAMUSCULAR | Status: AC
  Administered 2022-11-11: 100 ug via INTRAVENOUS

## 2022-11-11 MED ORDER — ROCURONIUM BROMIDE 10 MG/ML (PF) SYRINGE
PREFILLED_SYRINGE | INTRAVENOUS | Status: AC
  Filled 2022-11-11: qty 10

## 2022-11-11 MED ORDER — ACETAMINOPHEN 500 MG PO TABS
1000.0000 mg | ORAL_TABLET | Freq: Once | ORAL | Status: AC
  Administered 2022-11-11: 1000 mg via ORAL

## 2022-11-11 MED ORDER — LACTATED RINGERS IV SOLN: INTRAVENOUS | Status: DC

## 2022-11-11 MED ORDER — FENTANYL CITRATE (PF) 100 MCG/2ML IJ SOLN
INTRAMUSCULAR | Status: DC | PRN
  Administered 2022-11-11 (×2): 25 ug via INTRAVENOUS

## 2022-11-11 MED ORDER — BUPIVACAINE LIPOSOME 1.3 % IJ SUSP
INTRAMUSCULAR | Status: DC | PRN
  Administered 2022-11-11: 10 mL via PERINEURAL

## 2022-11-11 MED ORDER — BUPIVACAINE HCL (PF) 0.5 % IJ SOLN
INTRAMUSCULAR | Status: DC | PRN
  Administered 2022-11-11: 20 mL via PERINEURAL

## 2022-11-11 MED ORDER — IBUPROFEN 800 MG PO TABS: 800.0000 mg | ORAL_TABLET | Freq: Three times a day (TID) | ORAL | 0 refills | Status: AC

## 2022-11-11 MED ORDER — TRANEXAMIC ACID-NACL 1000-0.7 MG/100ML-% IV SOLN
INTRAVENOUS | Status: AC
  Filled 2022-11-11: qty 100

## 2022-11-11 MED ORDER — ACETAMINOPHEN 500 MG PO TABS
ORAL_TABLET | ORAL | Status: AC
  Filled 2022-11-11: qty 2

## 2022-11-11 MED ORDER — EPINEPHRINE PF 1 MG/ML IJ SOLN
INTRAMUSCULAR | Status: AC
  Filled 2022-11-11: qty 4

## 2022-11-11 MED ORDER — MEPERIDINE HCL 25 MG/ML IJ SOLN: 6.2500 mg | INTRAMUSCULAR | Status: DC | PRN

## 2022-11-11 MED ORDER — OXYCODONE HCL 5 MG PO TABS: 5.0000 mg | ORAL_TABLET | Freq: Once | ORAL | Status: DC | PRN

## 2022-11-11 MED ORDER — FENTANYL CITRATE (PF) 100 MCG/2ML IJ SOLN
INTRAMUSCULAR | Status: AC
  Filled 2022-11-11: qty 2

## 2022-11-11 MED ORDER — OXYCODONE HCL 5 MG/5ML PO SOLN: 5.0000 mg | Freq: Once | ORAL | Status: DC | PRN

## 2022-11-11 MED ORDER — TRANEXAMIC ACID-NACL 1000-0.7 MG/100ML-% IV SOLN
1000.0000 mg | INTRAVENOUS | Status: AC
  Administered 2022-11-11: 1000 mg via INTRAVENOUS

## 2022-11-11 MED ORDER — DEXAMETHASONE SODIUM PHOSPHATE 4 MG/ML IJ SOLN
INTRAMUSCULAR | Status: DC | PRN
  Administered 2022-11-11: 10 mg via INTRAVENOUS

## 2022-11-11 MED ORDER — CEFAZOLIN SODIUM-DEXTROSE 2-4 GM/100ML-% IV SOLN
INTRAVENOUS | Status: AC
  Filled 2022-11-11: qty 100

## 2022-11-11 MED ORDER — MIDAZOLAM HCL 2 MG/2ML IJ SOLN
2.0000 mg | Freq: Once | INTRAMUSCULAR | Status: AC
  Administered 2022-11-11: 2 mg via INTRAVENOUS

## 2022-11-11 MED ORDER — OXYCODONE HCL 5 MG PO TABS: 5.0000 mg | ORAL_TABLET | ORAL | 0 refills | Status: AC | PRN

## 2022-11-11 MED ORDER — MIDAZOLAM HCL 2 MG/2ML IJ SOLN
INTRAMUSCULAR | Status: AC
  Filled 2022-11-11: qty 2

## 2022-11-11 MED ORDER — ONDANSETRON HCL 4 MG/2ML IJ SOLN
INTRAMUSCULAR | Status: AC
  Filled 2022-11-11: qty 2

## 2022-11-11 MED ORDER — GABAPENTIN 300 MG PO CAPS
300.0000 mg | ORAL_CAPSULE | Freq: Once | ORAL | Status: AC
  Administered 2022-11-11: 300 mg via ORAL

## 2022-11-11 MED ORDER — CEFAZOLIN SODIUM-DEXTROSE 2-4 GM/100ML-% IV SOLN
2.0000 g | INTRAVENOUS | Status: AC
  Administered 2022-11-11: 2 g via INTRAVENOUS

## 2022-11-11 MED ORDER — HYDROMORPHONE HCL 1 MG/ML IJ SOLN
INTRAMUSCULAR | Status: AC
  Filled 2022-11-11: qty 0.5

## 2022-11-11 MED ORDER — PROMETHAZINE HCL 25 MG/ML IJ SOLN: 6.2500 mg | INTRAMUSCULAR | Status: DC | PRN

## 2022-11-11 MED ORDER — LIDOCAINE 2% (20 MG/ML) 5 ML SYRINGE
INTRAMUSCULAR | Status: DC | PRN
  Administered 2022-11-11: 60 mg via INTRAVENOUS

## 2022-11-11 MED ORDER — ACETAMINOPHEN 500 MG PO TABS: 500.0000 mg | ORAL_TABLET | Freq: Three times a day (TID) | ORAL | 0 refills | Status: AC

## 2022-11-11 MED ORDER — DEXAMETHASONE SODIUM PHOSPHATE 10 MG/ML IJ SOLN
INTRAMUSCULAR | Status: AC
  Filled 2022-11-11: qty 1

## 2022-11-11 MED ORDER — SCOPOLAMINE 1 MG/3DAYS TD PT72
MEDICATED_PATCH | TRANSDERMAL | Status: AC
  Filled 2022-11-11: qty 1

## 2022-11-11 MED ORDER — LIDOCAINE 2% (20 MG/ML) 5 ML SYRINGE
INTRAMUSCULAR | Status: AC
  Filled 2022-11-11: qty 5

## 2022-11-11 MED ORDER — AMISULPRIDE (ANTIEMETIC) 5 MG/2ML IV SOLN: 10.0000 mg | Freq: Once | INTRAVENOUS | Status: DC | PRN

## 2022-11-11 MED ORDER — SODIUM CHLORIDE 0.9 % IR SOLN
Status: DC | PRN
  Administered 2022-11-11: 12000 mL

## 2022-11-11 MED ORDER — PROPOFOL 10 MG/ML IV BOLUS
INTRAVENOUS | Status: DC | PRN
  Administered 2022-11-11: 200 mg via INTRAVENOUS

## 2022-11-11 MED ORDER — HYDROMORPHONE HCL 1 MG/ML IJ SOLN
0.2500 mg | INTRAMUSCULAR | Status: DC | PRN
  Administered 2022-11-11: 0.5 mg via INTRAVENOUS

## 2022-11-11 MED ORDER — EPHEDRINE SULFATE-NACL 50-0.9 MG/10ML-% IV SOSY
PREFILLED_SYRINGE | INTRAVENOUS | Status: DC | PRN
  Administered 2022-11-11 (×3): 5 mg via INTRAVENOUS

## 2022-11-11 MED ORDER — ASPIRIN 325 MG PO TBEC: 325.0000 mg | DELAYED_RELEASE_TABLET | Freq: Every day | ORAL | 0 refills | Status: AC

## 2022-11-11 MED ORDER — GABAPENTIN 300 MG PO CAPS
ORAL_CAPSULE | ORAL | Status: AC
  Filled 2022-11-11: qty 1

## 2022-11-11 MED ORDER — SODIUM CHLORIDE 0.9 % IR SOLN
Status: DC | PRN
  Administered 2022-11-11: 3000 mL

## 2022-11-11 SURGICAL SUPPLY — 71 items
ANCH SUT 2 FBRTK KNTLS 1.8 (Anchor) ×8 IMPLANT
ANCH SUT SHRT 12.5 CANN EYLT (Anchor) ×1 IMPLANT
ANCHOR SUT 1.8 FIBERTAK SB KL (Anchor) IMPLANT
ANCHOR SUT BIOCOMP LK 2.9X12.5 (Anchor) IMPLANT
APL PRP STRL LF DISP 70% ISPRP (MISCELLANEOUS) ×1
BLADE EXCALIBUR 4.0X13 (MISCELLANEOUS) ×1 IMPLANT
BLADE SHAVER TORPEDO 4X13 (MISCELLANEOUS) IMPLANT
BUR SURG 4D 13L RD FLUTE (BUR) IMPLANT
BURR OVAL 8 FLU 4.0X13 (MISCELLANEOUS) IMPLANT
BURR SURG 4D 13L RD FLUTE (BUR)
CANNULA 5.75X71 LONG (CANNULA) IMPLANT
CANNULA 7X7 TWIST-IN (CANNULA) IMPLANT
CANNULA 8.25X9 (CANNULA) IMPLANT
CANNULA PASSPORT 5 (CANNULA) IMPLANT
CANNULA PASSPORT BUTTON 10-40 (CANNULA) IMPLANT
CANNULA TWIST IN 8.25X7CM (CANNULA) IMPLANT
CHLORAPREP W/TINT 26 (MISCELLANEOUS) ×1 IMPLANT
COOLER ICEMAN CLASSIC (MISCELLANEOUS) ×1 IMPLANT
DRAPE IMP U-DRAPE 54X76 (DRAPES) ×1 IMPLANT
DRAPE INCISE IOBAN 66X45 STRL (DRAPES) ×1 IMPLANT
DRAPE POUCH INSTRU U-SHP 10X18 (DRAPES) ×1 IMPLANT
DRAPE SHOULDER BEACH CHAIR (DRAPES) ×1 IMPLANT
DRAPE U-SHAPE 47X51 STRL (DRAPES) ×2 IMPLANT
DRSG TEGADERM 4X4.75 (GAUZE/BANDAGES/DRESSINGS) IMPLANT
DW OUTFLOW CASSETTE/TUBE SET (MISCELLANEOUS) ×1 IMPLANT
GAUZE PAD ABD 8X10 STRL (GAUZE/BANDAGES/DRESSINGS) ×1 IMPLANT
GAUZE SPONGE 4X4 12PLY STRL (GAUZE/BANDAGES/DRESSINGS) ×1 IMPLANT
GAUZE XEROFORM 1X8 LF (GAUZE/BANDAGES/DRESSINGS) ×1 IMPLANT
GLOVE BIO SURGEON STRL SZ 6 (GLOVE) ×2 IMPLANT
GLOVE BIO SURGEON STRL SZ7.5 (GLOVE) ×1 IMPLANT
GLOVE BIOGEL PI IND STRL 6.5 (GLOVE) ×1 IMPLANT
GLOVE BIOGEL PI IND STRL 8 (GLOVE) ×1 IMPLANT
GLOVE ECLIPSE 8.0 STRL XLNG CF (GLOVE) ×1 IMPLANT
GLOVE SURG SYN 7.5  E (GLOVE)
GLOVE SURG SYN 7.5 E (GLOVE) IMPLANT
GLOVE SURG SYN 7.5 PF PI (GLOVE) ×1 IMPLANT
GOWN STRL REUS W/ TWL LRG LVL3 (GOWN DISPOSABLE) ×2 IMPLANT
GOWN STRL REUS W/TWL LRG LVL3 (GOWN DISPOSABLE) ×2
GOWN STRL REUS W/TWL XL LVL3 (GOWN DISPOSABLE) ×1 IMPLANT
KIT CVD SPEAR FBRTK 1.8 DRILL (KITS) IMPLANT
KIT PERC INSERT 3.0 KNTLS (KITS) IMPLANT
KIT PUSHLOCK 2.9 HIP (KITS) IMPLANT
KIT STR SPEAR 1.8 FBRTK DISP (KITS) IMPLANT
LASSO 90 CVE QUICKPAS (DISPOSABLE) IMPLANT
LASSO CRESCENT QUICKPASS (SUTURE) IMPLANT
MANIFOLD NEPTUNE II (INSTRUMENTS) ×1 IMPLANT
NDL SAFETY ECLIP 18X1.5 (MISCELLANEOUS) ×1 IMPLANT
PACK ARTHROSCOPY DSU (CUSTOM PROCEDURE TRAY) ×1 IMPLANT
PACK BASIN DAY SURGERY FS (CUSTOM PROCEDURE TRAY) ×1 IMPLANT
PAD COLD SHLDR WRAP-ON (PAD) ×1 IMPLANT
PORT APPOLLO RF 90DEGREE MULTI (SURGICAL WAND) IMPLANT
SHEET MEDIUM DRAPE 40X70 STRL (DRAPES) ×1 IMPLANT
SLEEVE ARM SUSPENSION SYSTEM (MISCELLANEOUS) ×1 IMPLANT
SLEEVE SCD COMPRESS KNEE MED (STOCKING) ×1 IMPLANT
SLING S3 LATERAL DISP (MISCELLANEOUS) ×1 IMPLANT
SUT ETHILON 3 0 PS 1 (SUTURE) ×1 IMPLANT
SUT FIBERWIRE #2 38 T-5 BLUE (SUTURE)
SUT LASSO 45 DEG R (SUTURE) IMPLANT
SUT PDS AB 1 CT  36 (SUTURE)
SUT PDS AB 1 CT 36 (SUTURE) IMPLANT
SUT TIGER TAPE 7 IN WHITE (SUTURE) IMPLANT
SUTURE FIBERWR #2 38 T-5 BLUE (SUTURE) IMPLANT
SUTURE TAPE 1.3 40 TPR END (SUTURE) IMPLANT
SUTURE TAPE TIGERLINK 1.3MM BL (SUTURE) IMPLANT
SUTURETAPE 1.3 40 TPR END (SUTURE)
SUTURETAPE TIGERLINK 1.3MM BL (SUTURE) ×1
SYR 5ML LL (SYRINGE) ×1 IMPLANT
TAPE FIBER 2MM 7IN #2 BLUE (SUTURE) IMPLANT
TOWEL GREEN STERILE FF (TOWEL DISPOSABLE) ×2 IMPLANT
TUBE CONNECTING 20X1/4 (TUBING) ×1 IMPLANT
TUBING ARTHROSCOPY IRRIG 16FT (MISCELLANEOUS) ×1 IMPLANT

## 2022-11-11 NOTE — Anesthesia Procedure Notes (Signed)
Procedure Name: LMA Insertion Date/Time: 11/11/2022 7:36 AM  Performed by: Caren Macadam, CRNAPre-anesthesia Checklist: Patient identified, Emergency Drugs available, Suction available and Patient being monitored Patient Re-evaluated:Patient Re-evaluated prior to induction Oxygen Delivery Method: Circle system utilized Preoxygenation: Pre-oxygenation with 100% oxygen Induction Type: IV induction Ventilation: Mask ventilation without difficulty LMA: LMA inserted LMA Size: 4.0 Number of attempts: 1 Placement Confirmation: positive ETCO2 and breath sounds checked- equal and bilateral Tube secured with: Tape Dental Injury: Teeth and Oropharynx as per pre-operative assessment

## 2022-11-11 NOTE — Transfer of Care (Signed)
Immediate Anesthesia Transfer of Care Note  Patient: Andrew Cunningham  Procedure(s) Performed: RIGHT SHOULDER ARTHROSCOPY WITH LABRAL REPAIR AND LOOSE BODY REMOVAL (Right: Shoulder)  Patient Location: PACU  Anesthesia Type:General and Regional  Level of Consciousness: drowsy  Airway & Oxygen Therapy: Patient Spontanous Breathing and Patient connected to nasal cannula oxygen  Post-op Assessment: Report given to RN and Post -op Vital signs reviewed and stable  Post vital signs: Reviewed and stable  Last Vitals:  Vitals Value Taken Time  BP 124/80 11/11/22 0945  Temp    Pulse 65 11/11/22 0946  Resp    SpO2 95 % 11/11/22 0946  Vitals shown include unvalidated device data.  Last Pain:  Vitals:   11/11/22 0633  TempSrc: Oral  PainSc: 2       Patients Stated Pain Goal: 4 (11/11/22 6198)  Complications: No notable events documented.

## 2022-11-11 NOTE — Interval H&P Note (Signed)
History and Physical Interval Note:  11/11/2022 6:53 AM  Andrew Cunningham  has presented today for surgery, with the diagnosis of RIGHT SHOULDER LOOSE BODY / LABRAL TEAR.  The various methods of treatment have been discussed with the patient and family. After consideration of risks, benefits and other options for treatment, the patient has consented to  Procedure(s): RIGHT SHOULDER ARTHROSCOPY WITH LABRAL REPAIR AND LOOSE BODY REMOVAL (Right) as a surgical intervention.  The patient's history has been reviewed, patient examined, no change in status, stable for surgery.  I have reviewed the patient's chart and labs.  Questions were answered to the patient's satisfaction.     Huel Cote

## 2022-11-11 NOTE — Op Note (Signed)
Date of Surgery: 11/11/2022  INDICATIONS: Mr. Andrew Cunningham is a 52 y.o.-year-old male with right a right shoulder labral tear which has failed conservative managment.  The risk and benefits of the procedure were discussed in detail and documented in the pre-operative evaluation.   PREOPERATIVE DIAGNOSIS: 1. Right shoulder labral tear  POSTOPERATIVE DIAGNOSIS: Same.  PROCEDURE: 1. Right shoulder superior labral repair 2. Right shoulder inferior labral repair 3. Right shoulder limited debridement  SURGEON: Benancio Deeds MD  ASSISTANT: Kerby Less, ATC  ANESTHESIA:  general plus interscalene nerve block  IV FLUIDS AND URINE: See anesthesia record.  ANTIBIOTICS: Ancef  ESTIMATED BLOOD LOSS: 10 mL.  IMPLANTS:  Implant Name Type Inv. Item Serial No. Manufacturer Lot No. LRB No. Used Action  ANCHOR SUT 1.8 FIBERT[employer] SB KL - [id number] Anchor ANCHOR SUT 1.8 FIBERTAK SB KL  [employer] [id number] Right 1 Implanted  ANCHOR SUT 1.8 FIBERTAK SB KL - [id number] Anchor ANCHOR SUT 1.8 FIBERTAK SB KL  [employer] [id number] Right 1 Implanted  ANCHOR SUT 1.8 FIBERTAK SB KL - [id number] Anchor ANCHOR SUT 1.8 FIBERTAK SB KL  [employer] [id number] Right 1 Implanted  ANCHOR SUT 1.8 FIBERTAK SB KL - [id number] Anchor ANCHOR SUT 1.8 FIBERTAK SB KL  [employer] [id number] Right 1 Implanted  ANCHOR SUT 1.8 FIBERTAK SB KL - [id number] Anchor ANCHOR SUT 1.8 FIBERTAK SB KL  [employer] [id number] Right 1 Implanted  ANCHOR SUT 1.8 FIBERTAK SB KL - [id number] Anchor ANCHOR SUT 1.8 FIBERTAK SB KL  [employer] [id number] Right 1 Implanted  ANCHOR SUT 1.8 FIBERTAK SB KL - [id number] Anchor ANCHOR SUT 1.8 FIBERTAK SB KL  [employer] [id number] Right 1 Implanted  ANCHOR SUT BIOCOMP LK 2.9X12.5 - [id number] Anchor ANCHOR SUT BIOCOMP LK 2.9X12.5  [employer] [id number] Right 1 Implanted  ANCHOR SUT 1.8 FIBERTAK SB KL - [id number] Anchor ANCHOR SUT 1.8 FIBERTAK SB KL  [employer] [id number] Right 1 Implanted    DRAINS:  None  CULTURES: None  COMPLICATIONS: none  DESCRIPTION OF PROCEDURE:  Examination under anesthesia revealed forward elevation of 165 degrees.  In abduction, there was 90 degrees of external rotation and 70 degrees of internal rotation.  With the arm at the side, there was 70 degrees of external rotation.  There is a 2+ anterior load shift and a 2+ posterior load shift with palpable click as the humerus moved over the glenoid.  Arthroscopic findings demonstrated:  Glenoid cartilage: Normal Humeral head: Normal Labrum:  labral tear from 2 o'clock to 11 o'clock Biceps insertion: Intact Biceps tendon: Intact Subscapularis insertion: Normal Rotator cuff: Normal  The patient was identified in the preoperative holding area.  The correct site was marked according to universal protocol.  Anesthesia performed an interscalene nerve block.  Ancef was given 1 hour prior to skin incision.  The patient was subsequently taken back to the operating room.  The patient was prepped and draped and positioned in the lateral position.  All bony prominences were padded.  Final timeout was performed.  Standard posterior, anterior and anterosuperolateral portals were utilized. The posterior portal was created with an 11-blade and the arthroscope introduced into the glenohumeral joint.  A full diagnostic arthroscopy was performed as described above.  A low anterior portal just above the rolled border of the subscapularis was identified with a spinal needle, and then instrumenting cannula was placed.  An anterosuperolateral viewing portal was localized with a spinal needle just posterior to the biceps tendon, and the arthroscope was  transferred to this portal.  A posterior portal was also placed for instrumentation.  First, I directed my attention to preparation of the glenoid, labrum and capsule for repair.  The elevator was used to elevate off the injured labrum from the glenoid rim.  A shaver was subsequently introduced  and used on forward in order to create bleeding bony bed for the labrum to heal back to.  Debridement was performed of the posterior and inferior labrum with combination of electrocautery and shaver.  Next, I sequentially repaired the capsule and labrum from the 2:00 position to the 10:00 position with a total of 5 anchors.  These were all suture knotless anchors as noted above.  Anchors were placed at the 2:30, 4:30, 6, 7:30, 9:30 positions.  At each location, a pilot hole was drilled, the anchor inserted and deployed.  Then, one limb of suture was shuttled around the labrum and capsule using a suture lasso, taking care to provide both medial to lateral and inferior to superior shift of the tissues.  This was subsequently fed into the knotless mechanism and tensioned.  This was done sequentially for all additional anchors.  Once completed, the labrum was restored to an anatomic position, and tension was restored to both bands of the IGHL.  The inferior capsular volume was normalized and the humeral head was centered on the glenoid.   All instruments were removed, fluid was evacuated, and the arthroscopy portals were closed with 3-0 nylon.  A sterile dressing was applied with Xeroform, gauze, ABD and Medipore tape followed by a Iceman device and a sling with an abduction pillow.    The patient awoke from anesthesia without difficulty and was transferred to PACU in stable condition.       POSTOPERATIVE PLAN: He will be nonweight bearing on the right arm pending physical therapy. He will be mobilization and passive range of motion. He will be placed on aspirin for blood clot prevention  Benancio Deeds, MD 9:32 AM

## 2022-11-11 NOTE — Brief Op Note (Signed)
   Brief Op Note  Date of Surgery: 11/11/2022  Preoperative Diagnosis: RIGHT SHOULDER LOOSE BODY / LABRAL TEAR  Postoperative Diagnosis: same  Procedure: Procedure(s): RIGHT SHOULDER ARTHROSCOPY WITH LABRAL REPAIR AND LOOSE BODY REMOVAL  Implants: Implant Name Type Inv. Item Serial No. Manufacturer Lot No. LRB No. Used Action  ANCHOR SUT 1.8 FIBERTAK SB KL - [id number] Anchor ANCHOR SUT 1.8 FIBERTAK SB KL  [employer] [id number] Right 1 Implanted  ANCHOR SUT 1.8 FIBERTAK SB KL - [id number] Anchor ANCHOR SUT 1.8 FIBERTAK SB KL  [employer] [id number] Right 1 Implanted  ANCHOR SUT 1.8 FIBERTAK SB KL - [id number] Anchor ANCHOR SUT 1.8 FIBERTAK SB KL  [employer] [id number] Right 1 Implanted  ANCHOR SUT 1.8 FIBERTAK SB KL - [id number] Anchor ANCHOR SUT 1.8 FIBERTAK SB KL  [employer] [id number] Right 1 Implanted  ANCHOR SUT 1.8 FIBERTAK SB KL - [id number] Anchor ANCHOR SUT 1.8 FIBERTAK SB KL  [employer] [id number] Right 1 Implanted  ANCHOR SUT 1.8 FIBERTAK SB KL - [id number] Anchor ANCHOR SUT 1.8 FIBERTAK SB KL  [employer] [id number] Right 1 Implanted  ANCHOR SUT 1.8 FIBERTAK SB KL - [id number] Anchor ANCHOR SUT 1.8 FIBERTAK SB KL  [employer] [id number] Right 1 Implanted  ANCHOR SUT BIOCOMP LK 2.9X12.5 - [id number] Anchor ANCHOR SUT BIOCOMP LK 2.9X12.5  [employer] [id number] Right 1 Implanted  ANCHOR SUT 1.8 FIBERTAK SB KL - [id number] Anchor ANCHOR SUT 1.8 [employer] [id number] Right 1 Implanted    Surgeons: Surgeon(s): Huel Cote, MD  Anesthesia: Regional    Estimated Blood Loss: See anesthesia record  Complications: None  Condition to PACU: Stable  Benancio Deeds, MD 11/11/2022 9:32 AM

## 2022-11-11 NOTE — Anesthesia Postprocedure Evaluation (Signed)
Anesthesia Post Note  Patient: Andrew Cunningham  Procedure(s) Performed: RIGHT SHOULDER ARTHROSCOPY WITH LABRAL REPAIR AND LOOSE BODY REMOVAL (Right: Shoulder)     Patient location during evaluation: PACU Anesthesia Type: Regional and General Level of consciousness: awake and alert Pain management: pain level controlled Vital Signs Assessment: post-procedure vital signs reviewed and stable Respiratory status: spontaneous breathing, nonlabored ventilation and respiratory function stable Cardiovascular status: blood pressure returned to baseline and stable Postop Assessment: no apparent nausea or vomiting Anesthetic complications: no   No notable events documented.  Last Vitals:  Vitals:   11/11/22 1030 11/11/22 1055  BP: (!) 141/101 (!) 140/89  Pulse: 78 81  Resp: 13 18  Temp:  36.4 C  SpO2: 92% 94%    Last Pain:  Vitals:   11/11/22 1055  TempSrc:   PainSc: 2                  Lowella Curb

## 2022-11-11 NOTE — Anesthesia Preprocedure Evaluation (Signed)
Anesthesia Evaluation  Patient identified by MRN, date of birth, ID band Patient awake    Reviewed: Allergy & Precautions, H&P , NPO status , Patient's Chart, lab work & pertinent test results  History of Anesthesia Complications (+) PONV and history of anesthetic complications  Airway Mallampati: II  TM Distance: >3 FB Neck ROM: Full    Dental no notable dental hx.    Pulmonary neg pulmonary ROS, former smoker   Pulmonary exam normal breath sounds clear to auscultation       Cardiovascular hypertension, Pt. on medications negative cardio ROS Normal cardiovascular exam Rhythm:Regular Rate:Normal     Neuro/Psych   Anxiety     negative neurological ROS  negative psych ROS   GI/Hepatic Neg liver ROS,GERD  ,,  Endo/Other  negative endocrine ROS    Renal/GU negative Renal ROS  negative genitourinary   Musculoskeletal  (+) Arthritis , Osteoarthritis,    Abdominal   Peds negative pediatric ROS (+)  Hematology negative hematology ROS (+)   Anesthesia Other Findings   Reproductive/Obstetrics negative OB ROS                             Anesthesia Physical Anesthesia Plan  ASA: 2  Anesthesia Plan: General and Regional   Post-op Pain Management: Regional block*, Tylenol PO (pre-op)*, Gabapentin PO (pre-op)* and Minimal or no pain anticipated   Induction: Intravenous  PONV Risk Score and Plan: 3 and Ondansetron, Dexamethasone, Midazolam and Treatment may vary due to age or medical condition  Airway Management Planned: LMA and Oral ETT  Additional Equipment:   Intra-op Plan:   Post-operative Plan: Extubation in OR  Informed Consent: I have reviewed the patients History and Physical, chart, labs and discussed the procedure including the risks, benefits and alternatives for the proposed anesthesia with the patient or authorized representative who has indicated his/her understanding and  acceptance.     Dental advisory given  Plan Discussed with: CRNA  Anesthesia Plan Comments:        Anesthesia Quick Evaluation

## 2022-11-11 NOTE — Progress Notes (Signed)
Assisted Dr. Hyacinth Meeker with right, interscalene , ultrasound guided block. Side rails up, monitors on throughout procedure. See vital signs in flow sheet. Tolerated Procedure well.

## 2022-11-11 NOTE — Discharge Instructions (Addendum)
Discharge Instructions    Attending Surgeon: Huel Cote, MD Office Phone Number: [phone]   Diagnosis and Procedures:    Surgeries Performed: Right shoulder labral repari  Discharge Plan:    Diet: Resume usual diet. Begin with light or bland foods.  Drink plenty of fluids.  Activity:  Non weight bearing right arm in sling. You are advised to go home directly from the hospital or surgical center. Restrict your activities.  GENERAL INSTRUCTIONS: 1.  Please apply ice to your wound to help with swelling and inflammation. This will improve your comfort and your overall recovery following surgery.     2. Please call Dr. Serena Croissant office at [phone] with questions Monday-Friday during business hours. If no one answers, please leave a message and someone should get back to the patient within 24 hours. For emergencies please call 911 or proceed to the emergency room.   3. Patient to notify surgical team if experiences any of the following: Bowel/Bladder dysfunction, uncontrolled pain, nerve/muscle weakness, incision with increased drainage or redness, nausea/vomiting and Fever greater than 101.0 F.  Be alert for signs of infection including redness, streaking, odor, fever or chills. Be alert for excessive pain or bleeding and notify your surgeon immediately.  WOUND INSTRUCTIONS:   Leave your dressing, cast, or splint in place until your post operative visit.  Keep it clean and dry.  Always keep the incision clean and dry until the staples/sutures are removed. If there is no drainage from the incision you should keep it open to air. If there is drainage from the incision you must keep it covered at all times until the drainage stops  Do not soak in a bath tub, hot tub, pool, lake or other body of water until 21 days after your surgery and your incision is completely dry and healed.  If you have removable sutures (or staples) they must be removed 10-14 days (unless  otherwise instructed) from the day of your surgery.     1)  Elevate the extremity as much as possible.  2)  Keep the dressing clean and dry.  3)  Please call us if the dressing becomes wet or dirty.  4)  If you are experiencing worsening pain or worsening swelling, please call.     MEDICATIONS: Resume all previous home medications at the previous prescribed dose and frequency unless otherwise noted Start taking the  pain medications on an as-needed basis as prescribed  Please taper down pain medication over the next week following surgery.  Ideally you should not require a refill of any narcotic pain medication.  Take pain medication with food to minimize nausea. In addition to the prescribed pain medication, you may take over-the-counter pain relievers such as Tylenol.  Do NOT take additional tylenol if your pain medication already has tylenol in it.  Aspirin 325mg  daily for four weeks.      FOLLOWUP INSTRUCTIONS: 1. Follow up at the Physical Therapy Clinic 3-4 days following surgery. This appointment should be scheduled unless other arrangements have been made.The Physical Therapy scheduling number is [phone] if an appointment has not already been arranged.  2. Contact Dr. [phone] office during office hours at [phone] or the practice after hours line at [phone] for non-emergencies. For medical emergencies call 911.   Discharge Location: Home   You may have Tylenol again after 12:40pm today, if needed.   Post Anesthesia Home Care Instructions  Activity: Get plenty of rest for the remainder of the day.  A responsible individual must stay with you for 24 hours following the procedure.  For the next 24 hours, DO NOT: -Drive a car -Advertising copywriter -Drink alcoholic beverages -Take any medication unless instructed by your physician -Make any legal decisions or sign important papers.  Meals: Start with liquid foods such as gelatin or soup. Progress to regular  foods as tolerated. Avoid greasy, spicy, heavy foods. If nausea and/or vomiting occur, drink only clear liquids until the nausea and/or vomiting subsides. Call your physician if vomiting continues.  Special Instructions/Symptoms: Your throat may feel dry or sore from the anesthesia or the breathing tube placed in your throat during surgery. If this causes discomfort, gargle with warm salt water. The discomfort should disappear within 24 hours.  If you had a scopolamine patch placed behind your ear for the management of post- operative nausea and/or vomiting:  1. The medication in the patch is effective for 72 hours, after which it should be removed.  Wrap patch in a tissue and discard in the trash. Wash hands thoroughly with soap and water. 2. You may remove the patch earlier than 72 hours if you experience unpleasant side effects which may include dry mouth, dizziness or visual disturbances. 3. Avoid touching the patch. Wash your hands with soap and water after contact with the patch.   Regional Anesthesia Blocks  1. Numbness or the inability to move the "blocked" extremity may last from 3-48 hours after placement. The length of time depends on the medication injected and your individual response to the medication. If the numbness is not going away after 48 hours, call your surgeon.  2. The extremity that is blocked will need to be protected until the numbness is gone and the  Strength has returned. Because you cannot feel it, you will need to take extra care to avoid injury. Because it may be weak, you may have difficulty moving it or using it. You may not know what position it is in without looking at it while the block is in effect.  3. For blocks in the legs and feet, returning to weight bearing and walking needs to be done carefully. You will need to wait until the numbness is entirely gone and the strength has returned. You should be able to move your leg and foot normally before you try and  bear weight or walk. You will need someone to be with you when you first try to ensure you do not fall and possibly risk injury.  4. Bruising and tenderness at the needle site are common side effects and will resolve in a few days.  5. Persistent numbness or new problems with movement should be communicated to the surgeon or the [facility] [phone] [facility] [phone]).

## 2022-11-11 NOTE — Anesthesia Procedure Notes (Signed)
Anesthesia Regional Block: Interscalene brachial plexus block   Pre-Anesthetic Checklist: , timeout performed,  Correct Patient, Correct Site, Correct Laterality,  Correct Procedure, Correct Position, site marked,  Risks and benefits discussed,  Surgical consent,  Pre-op evaluation,  At surgeon's request and post-op pain management  Laterality: Right  Prep: chloraprep       Needles:  Injection technique: Single-shot  Needle Type: Stimiplex     Needle Length: 9cm  Needle Gauge: 21     Additional Needles:   Procedures:,,,, ultrasound used (permanent image in chart),,    Narrative:  Start time: 11/11/2022 7:13 AM End time: 11/11/2022 7:18 AM Injection made incrementally with aspirations every 5 mL.  Performed by: Personally  Anesthesiologist: Lowella Curb, MD

## 2022-11-12 ENCOUNTER — Encounter (HOSPITAL_BASED_OUTPATIENT_CLINIC_OR_DEPARTMENT_OTHER): Payer: Self-pay | Admitting: Orthopaedic Surgery

## 2022-11-14 ENCOUNTER — Ambulatory Visit (HOSPITAL_BASED_OUTPATIENT_CLINIC_OR_DEPARTMENT_OTHER): Payer: No Typology Code available for payment source | Attending: Orthopaedic Surgery | Admitting: Physical Therapy

## 2022-11-14 ENCOUNTER — Encounter (HOSPITAL_BASED_OUTPATIENT_CLINIC_OR_DEPARTMENT_OTHER): Payer: Self-pay | Admitting: Physical Therapy

## 2022-11-14 DIAGNOSIS — R600 Localized edema: Secondary | ICD-10-CM | POA: Insufficient documentation

## 2022-11-14 DIAGNOSIS — Y939 Activity, unspecified: Secondary | ICD-10-CM | POA: Diagnosis not present

## 2022-11-14 DIAGNOSIS — M6281 Muscle weakness (generalized): Secondary | ICD-10-CM | POA: Insufficient documentation

## 2022-11-14 DIAGNOSIS — S43431A Superior glenoid labrum lesion of right shoulder, initial encounter: Secondary | ICD-10-CM | POA: Insufficient documentation

## 2022-11-14 DIAGNOSIS — M25611 Stiffness of right shoulder, not elsewhere classified: Secondary | ICD-10-CM | POA: Insufficient documentation

## 2022-11-14 DIAGNOSIS — Y929 Unspecified place or not applicable: Secondary | ICD-10-CM | POA: Diagnosis not present

## 2022-11-14 DIAGNOSIS — M25511 Pain in right shoulder: Secondary | ICD-10-CM | POA: Insufficient documentation

## 2022-11-14 NOTE — Therapy (Signed)
OUTPATIENT PHYSICAL THERAPY SHOULDER EVALUATION   Patient Name: Andrew Cunningham MRN: [id number] DOB:1971/05/30, 52 y.o., male Today's Date: 11/14/2022  END OF SESSION:  PT End of Session - 11/14/22 1126     Visit Number 1    Number of Visits 21    Date for PT Re-Evaluation 01/09/23    Authorization Type [employer]    Authorization Time Period 11/14/22 to 01/23/23    PT Start Time 1015    PT Stop Time 1100    PT Time Calculation (min) 45 min    Activity Tolerance Patient tolerated treatment well    Behavior During Therapy WFL for tasks assessed/performed             Past Medical History:  Diagnosis Date   Anxiety    per patient    Cervical spine arthritis    COVID-19    ED (erectile dysfunction)    GERD (gastroesophageal reflux disease)    per patient    Hyperlipemia    per patient    Hypertension    per patient    PONV (postoperative nausea and vomiting)    Situational stress    Past Surgical History:  Procedure Laterality Date   APPENDECTOMY  2005   HERNIA REPAIR  1992   SHOULDER ARTHROSCOPY WITH LABRAL REPAIR Right 11/11/2022   Procedure: RIGHT SHOULDER ARTHROSCOPY WITH LABRAL REPAIR AND LOOSE BODY REMOVAL;  Surgeon: Huel Cote, MD;  Location: [facility];  Service: Orthopedics;  Laterality: Right;   Patient Active Problem List   Diagnosis Date Noted   Labral tear of shoulder, right, initial encounter 11/11/2022   Cervical spine arthritis 08/22/2020   History of anxiety 08/22/2020   History of gastroesophageal reflux (GERD) 08/22/2020   Essential hypertension 08/22/2020   History of hyperlipidemia 08/22/2020    PCP: Tally Joe MD   REFERRING PROVIDER: Huel Cote, MD  REFERRING DIAG:  [phone] (ICD-10-CM) - Labral tear of shoulder, right, initial encounter    THERAPY DIAG:  Acute pain of right shoulder  Stiffness of right shoulder, not elsewhere classified  Muscle weakness (generalized)  Localized edema  Rationale for  Evaluation and Treatment: Rehabilitation  ONSET DATE: 10/15/2022  SUBJECTIVE:                                                                                                                                                                                      SUBJECTIVE STATEMENT:  Just had surgery a couple days ago, shoulder has been feeling good pain has been under control. Came pre-medicated today, would like my wife to see how you change the dressings.   PERTINENT HISTORY: 52 y.o. male right-hand-dominant  male presents with right shoulder pain and weakness now ongoing for 6 months.  At this time he has tried activity restriction and activity modification.  He has done physical therapy for strengthening of the shoulder.  He is trialed anti-inflammatories.  I did discuss that his exam is consistent with a labral tear with an intra-articular loose body.  Given the fact that there is a loose body we discussed the role for arthroscopic intervention and debridement with labral repair as needed.  I discussed the rehab associated with this.  I did also discuss an additional option would be to try steroid injection particularly as he has an upcoming trip to [place] for which she will be doing significant golfing.  I advised that this is also a possibility.  He would like to consider his options and he will call us back to let us know  PAIN:  Are you having pain? Yes: NPRS scale: 1/10; 8/10 at worst when shrugging shoulder or adjusting position  Pain location: R shoulder  Pain description: sharp and tense, shooting  Aggravating factors: movement Relieving factors: iceman, medicine  PRECAUTIONS: Other: NWB R UE, superior and inferior labral repair   WEIGHT BEARING RESTRICTIONS: Yes NWB RUE   FALLS:  Has patient fallen in last 6 months? 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 Steward Drone January 30th or 31st   OBJECTIVE:   DIAGNOSTIC FINDINGS:  IMPRESSION: 1. Rotator cuff tendinosis without tear. 2. Tear of the posterosuperior labrum. A multilobulated paralabral cyst extends along the posterior aspect of the glenohumeral joint extending towards the quadrilateral space measuring approximately 3.3 x 0.9 x 1.1 cm. 3. Mild degenerative changes of the [facility] joint with trace AC joint effusion. Mild pericapsular edema may be reactive or posttraumatic. 4. Trace subacromial-subdeltoid bursitis.  PROCEDURE: 1. Right shoulder superior labral repair 2. Right shoulder inferior labral repair 3. Right shoulder limited debridement  POSTOPERATIVE PLAN: He will be nonweight bearing on the right arm pending physical therapy. He will be mobilization and passive range of motion. He will be placed on aspirin for blood clot prevention  PATIENT SURVEYS:  FOTO will do 2nd session   COGNITION: Overall cognitive status: Within functional limits for tasks assessed     SENSATION: Not tested  POSTURE: Rounded shoulder, forward head   UPPER EXTREMITY ROM:   Passive ROM Right eval Left eval  Shoulder flexion 70*   Shoulder extension    Shoulder abduction 25*   Shoulder adduction    Shoulder internal rotation    Shoulder external rotation -45* limited by pain    Elbow flexion    Elbow extension    Wrist flexion    Wrist extension    Wrist ulnar deviation    Wrist radial deviation    Wrist pronation    Wrist supination    (Blank rows = not tested)  UPPER EXTREMITY MMT:  MMT Right eval Left eval  Shoulder flexion    Shoulder extension    Shoulder abduction     Shoulder adduction    Shoulder internal rotation    Shoulder external rotation  Middle trapezius    Lower trapezius    Elbow flexion    Elbow extension    Wrist flexion    Wrist extension    Wrist ulnar deviation    Wrist radial deviation    Wrist pronation    Wrist supination    Grip strength (lbs)    (Blank rows = not tested)  DNT due to new surgery, restrictions of protocol   SHOULDER SPECIAL TESTS:   JOINT MOBILITY TESTING:    PALPATION:     TODAY'S TREATMENT:                                                                                                                                         DATE:   Eval  Objective measures/assessment + education as appropriate   Changed dressings, incisions C/D/I no signs of inflammation or infection   PATIENT EDUCATION: Education details: exam findings, POC, HEP; lots of education on avoiding getting incisions wet, technique to wash armpit surgical UE safely, general course of recovery after surgery, general expectation and timeline of functional recovery, very pain limited today so would recommend oxy 90 minutes prior to PT instead of just ibuprofen, place bandaids or gauze + tegaderm if dressings get wet or come off, importance of early ROM in recovery from shoulder surgery  Person educated: Patient and Spouse Education method: Explanation, Demonstration, and Handouts Education comprehension: verbalized understanding and returned demonstration  HOME EXERCISE PROGRAM: Access Code: [email or web address] URL: [email or web address] Date: 11/14/2022 Prepared by: Nedra Hai  Exercises - Flexion-Extension Shoulder Pendulum with Table Support  - 1 x daily - 7 x weekly - 3 sets - 10 reps - Horizontal Shoulder Pendulum with Table Support  - 1 x daily - 7 x weekly - 3 sets - 10 reps - Seated Elbow Flexion and Extension AROM  - 1 x daily - 7 x weekly - 3 sets - 10 reps - Wrist Flexion Extension AROM with Fingers Curled and  Palm Down  - 1 x daily - 7 x weekly - 3 sets - 10 reps - Seated Gripping Towel  - 1 x daily - 7 x weekly - 3 sets - 10 reps - Thumb AROM Opposition To All Fingers  - 1 x daily - 7 x weekly - 3 sets - 10 reps  ASSESSMENT:  CLINICAL IMPRESSION: Patient is a 52 y.o. M who was seen today for physical therapy evaluation and treatment for skilled PT care s/p shoulder labrum surgery. Exam is typical and as expected being 3 days post-op, he was a bit pain limited today so I recommended oxy before PT sessions instead of just ibuprofen. Seemed a bit anxious in general about recovery, took time to answer all questions. Displays limited shoulder ROM and strength as expected post op. Will benefit from skilled PT services to address functional limitations, reduce pain, and assist in return to full PLOF moving forward.  OBJECTIVE IMPAIRMENTS: decreased knowledge of condition, decreased ROM, decreased strength, hypomobility, increased edema, increased fascial restrictions, increased muscle spasms, impaired flexibility, impaired UE functional use, and pain.   ACTIVITY LIMITATIONS: carrying, lifting, bathing, toileting, dressing, self feeding, reach over head, and hygiene/grooming  PARTICIPATION LIMITATIONS: meal prep, cleaning, laundry, medication management, interpersonal relationship, driving, shopping, community activity, and occupation  PERSONAL FACTORS: Age, Behavior pattern, Education, Fitness, and Past/current experiences are also affecting patient's functional outcome.   REHAB POTENTIAL: Excellent  CLINICAL DECISION MAKING: Stable/uncomplicated  EVALUATION COMPLEXITY: Low   GOALS: Goals reviewed with patient? Yes  SHORT TERM GOALS: Target date: 12/12/2022    Will be compliant with appropriate progressive HEP Baseline: Goal status: INITIAL  2.  PROM and AAROM of surgical shoulder to be at least 150* flexion and abduction, PROM and AAROM ER/IR to be at least 45* without increase in pain   Baseline:  Goal status: INITIAL  3.  Pain in surgical shoulder to be no more than 4/10 at worst  Baseline:  Goal status: INITIAL  4.  Will be able to perform computer tasks on a Mod(I) basis with ergonomic and supportive adjustments PRN and no increase in pain  Baseline:  Goal status: INITIAL   LONG TERM GOALS: Target date: 01/09/2023    MMT surgical UE and periscap musculature  to be at least 4/5  Baseline:  Goal status: INITIAL  2.  Surgical UE AROM flexion and abduction to be full, ER/IR AROM to be at least 60 degrees  Baseline:  Goal status: INITIAL  3.  Will be able to perform all dressing/bathing/self care tasks on an independent basis  Baseline:  Goal status: INITIAL  4.  Pain to be no more than 2/10 at worst Baseline:  Goal status: INITIAL  5.  Will tolerate light strengthening activities within protocol to assist in prep for return to sports specific tasks  Baseline:  Goal status: INITIAL  PLAN:  PT FREQUENCY:  3x/week for 4 weeks, then 2x/week for next 4 weeks   PT DURATION: 8 weeks  PLANNED INTERVENTIONS: Therapeutic exercises, Therapeutic activity, Neuromuscular re-education, Balance training, Gait training, Patient/Family education, Self Care, Joint mobilization, Aquatic Therapy, Dry Needling, Electrical stimulation, Cryotherapy, Moist heat, Taping, Ultrasound, Ionotophoresis 4mg /ml Dexamethasone, Manual therapy, and Re-evaluation  PLAN FOR NEXT SESSION: per protocol    Deniece Ree PT DPT PN2  11/14/2022, 11:27 AM

## 2022-11-18 ENCOUNTER — Encounter (HOSPITAL_BASED_OUTPATIENT_CLINIC_OR_DEPARTMENT_OTHER): Payer: Self-pay | Admitting: Physical Therapy

## 2022-11-18 ENCOUNTER — Ambulatory Visit (HOSPITAL_BASED_OUTPATIENT_CLINIC_OR_DEPARTMENT_OTHER): Payer: No Typology Code available for payment source | Admitting: Physical Therapy

## 2022-11-18 DIAGNOSIS — S43431A Superior glenoid labrum lesion of right shoulder, initial encounter: Secondary | ICD-10-CM | POA: Diagnosis not present

## 2022-11-18 DIAGNOSIS — M6281 Muscle weakness (generalized): Secondary | ICD-10-CM

## 2022-11-18 DIAGNOSIS — R600 Localized edema: Secondary | ICD-10-CM

## 2022-11-18 DIAGNOSIS — M25611 Stiffness of right shoulder, not elsewhere classified: Secondary | ICD-10-CM

## 2022-11-18 DIAGNOSIS — M25511 Pain in right shoulder: Secondary | ICD-10-CM

## 2022-11-18 NOTE — Therapy (Signed)
Per secure Epic chat with referring (Dr. Steward Drone), OK for PROM as tolerated at this point. PROM only for now, no AAROM or AROM.   Nedra Hai PT DPT PN2

## 2022-11-18 NOTE — Therapy (Signed)
OUTPATIENT PHYSICAL THERAPY SHOULDER TREATMENT    Patient Name: Andrew Cunningham MRN: [id number] DOB:17-May-1971, 52 y.o., male Today's Date: 11/18/2022  END OF SESSION:  PT End of Session - 11/18/22 1456     Visit Number 2    Number of Visits 21    Date for PT Re-Evaluation 01/09/23    Authorization Type [employer]    Authorization Time Period 11/14/22 to 01/23/23    PT Start Time 1432    PT Stop Time 1506   limited by protocol   PT Time Calculation (min) 34 min    Activity Tolerance Patient tolerated treatment well    Behavior During Therapy [facility] for tasks assessed/performed              Past Medical History:  Diagnosis Date   Anxiety    per patient    Cervical spine arthritis    COVID-19    ED (erectile dysfunction)    GERD (gastroesophageal reflux disease)    per patient    Hyperlipemia    per patient    Hypertension    per patient    PONV (postoperative nausea and vomiting)    Situational stress    Past Surgical History:  Procedure Laterality Date   APPENDECTOMY  2005   HERNIA REPAIR  1992   SHOULDER ARTHROSCOPY WITH LABRAL REPAIR Right 11/11/2022   Procedure: RIGHT SHOULDER ARTHROSCOPY WITH LABRAL REPAIR AND LOOSE BODY REMOVAL;  Surgeon: Huel Cote, MD;  Location: [facility];  Service: Orthopedics;  Laterality: Right;   Patient Active Problem List   Diagnosis Date Noted   Labral tear of shoulder, right, initial encounter 11/11/2022   Cervical spine arthritis 08/22/2020   History of anxiety 08/22/2020   History of gastroesophageal reflux (GERD) 08/22/2020   Essential hypertension 08/22/2020   History of hyperlipidemia 08/22/2020    PCP: Tally Joe MD   REFERRING PROVIDER: Huel Cote, MD  REFERRING DIAG:  [phone] (ICD-10-CM) - Labral tear of shoulder, right, initial encounter    THERAPY DIAG:  Acute pain of right shoulder  Stiffness of right shoulder, not elsewhere classified  Muscle weakness (generalized)  Localized  edema  Rationale for Evaluation and Treatment: Rehabilitation  ONSET DATE: 10/15/2022  SUBJECTIVE:                                                                                                                                                                                      SUBJECTIVE STATEMENT:   Shoulder is feeling progressively better, tweaked a muscle around my shoulder blade doing an exercise not sure how. Using hand more and trying to do more, its very hard to stay still.  PERTINENT HISTORY: 52 y.o. male right-hand-dominant male presents with right shoulder pain and weakness now ongoing for 6 months.  At this time he has tried activity restriction and activity modification.  He has done physical therapy for strengthening of the shoulder.  He is trialed anti-inflammatories.  I did discuss that his exam is consistent with a labral tear with an intra-articular loose body.  Given the fact that there is a loose body we discussed the role for arthroscopic intervention and debridement with labral repair as needed.  I discussed the rehab associated with this.  I did also discuss an additional option would be to try steroid injection particularly as he has an upcoming trip to [place] for which she will be doing significant golfing.  I advised that this is also a possibility.  He would like to consider his options and he will call us back to let us know  PAIN:  Are you having pain? Yes: NPRS scale: 1/10 at rest  Pain location: R shoulder  Pain description: dull   Aggravating factors: jolts/bumping into stuff  Relieving factors: iceman, medicine  PRECAUTIONS: Other: NWB R UE, superior and inferior labral repair   WEIGHT BEARING RESTRICTIONS: Yes NWB RUE   FALLS:  Has patient fallen in last 6 months? 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 Steward Drone January 30th or 31st   OBJECTIVE:   DIAGNOSTIC FINDINGS:  IMPRESSION: 1. Rotator cuff tendinosis without tear. 2. Tear of the posterosuperior labrum. A multilobulated paralabral cyst extends along the posterior aspect of the glenohumeral joint extending towards the quadrilateral space measuring approximately 3.3 x 0.9 x 1.1 cm. 3. Mild degenerative changes of the [facility] joint with trace AC joint effusion. Mild pericapsular edema may be reactive or posttraumatic. 4. Trace subacromial-subdeltoid bursitis.  PROCEDURE: 1. Right shoulder superior labral repair 2. Right shoulder inferior labral repair 3. Right shoulder limited debridement  POSTOPERATIVE PLAN: He will be nonweight bearing on the right arm pending physical therapy. He will be mobilization and passive range of motion. He will be placed on aspirin for blood clot prevention  PATIENT SURVEYS:  FOTO will do 2nd session   COGNITION: Overall cognitive status: Within functional limits for tasks assessed     SENSATION: Not tested  POSTURE: Rounded shoulder, forward head   UPPER EXTREMITY ROM:   Passive ROM Right eval Left eval  Shoulder flexion 70*   Shoulder extension    Shoulder abduction 25*   Shoulder adduction    Shoulder internal rotation    Shoulder external rotation -45* limited by pain    Elbow flexion    Elbow extension    Wrist flexion    Wrist extension    Wrist ulnar deviation    Wrist radial deviation    Wrist pronation    Wrist supination    (Blank rows = not tested)  UPPER EXTREMITY MMT:  MMT Right eval Left eval  Shoulder flexion    Shoulder extension    Shoulder abduction    Shoulder  adduction    Shoulder internal rotation    Shoulder external rotation  Middle trapezius    Lower trapezius    Elbow flexion    Elbow extension    Wrist flexion    Wrist extension    Wrist ulnar deviation    Wrist radial deviation    Wrist pronation    Wrist supination    Grip strength (lbs)    (Blank rows = not tested)  DNT due to new surgery, restrictions of protocol   SHOULDER SPECIAL TESTS:   JOINT MOBILITY TESTING:    PALPATION:     TODAY'S TREATMENT:                                                                                                                                         DATE:   11/18/22  Manual  Flexion, ABD, ER PROM with no increase in pain  - Flexion PROM to 120* - ABD PROM to 90* - ER PROM to 20* at 0* ABD  TherEx  Chin tucks x10 3 seconds  Upper trap stretch 2x30 seconds B Levator stretch 2x30 seconds B  Scap retractions x12   Eval  Objective measures/assessment + education as appropriate   Changed dressings, incisions C/D/I no signs of inflammation or infection   PATIENT EDUCATION: Education details: intervention purpose  Person educated: Patient and Spouse Education method: Explanation, Facilities manager, and Handouts Education comprehension: verbalized understanding and returned demonstration  HOME EXERCISE PROGRAM: Access Code: [email or web address] URL: [email or web address] Date: 11/14/2022 Prepared by: Nedra Hai  Exercises - Flexion-Extension Shoulder Pendulum with Table Support  - 1 x daily - 7 x weekly - 3 sets - 10 reps - Horizontal Shoulder Pendulum with Table Support  - 1 x daily - 7 x weekly - 3 sets - 10 reps - Seated Elbow Flexion and Extension AROM  - 1 x daily - 7 x weekly - 3 sets - 10 reps - Wrist Flexion Extension AROM with Fingers Curled and Palm Down  - 1 x daily - 7 x weekly - 3 sets - 10 reps - Seated Gripping Towel  - 1 x daily - 7 x weekly - 3 sets - 10 reps - Thumb AROM Opposition To All  Fingers  - 1 x daily - 7 x weekly - 3 sets - 10 reps  ASSESSMENT:  CLINICAL IMPRESSION:  Caron arrives today doing much better, we worked on shoulder PROM today (which was much improved) as well as some exercises for cervical pain (possibly related to sling). Really made some big improvements from eval to today- will continue as able.   OBJECTIVE IMPAIRMENTS: decreased knowledge of condition, decreased ROM, decreased strength, hypomobility, increased edema, increased fascial restrictions, increased muscle spasms, impaired flexibility, impaired UE functional use, and pain.   ACTIVITY LIMITATIONS: carrying, lifting, bathing, toileting, dressing, self feeding, reach over head, and hygiene/grooming  PARTICIPATION LIMITATIONS: meal prep, cleaning, laundry, medication management, interpersonal relationship, driving, shopping, community activity, and occupation  PERSONAL FACTORS: Age,  Behavior pattern, Education, Fitness, and Past/current experiences are also affecting patient's functional outcome.   REHAB POTENTIAL: Excellent  CLINICAL DECISION MAKING: Stable/uncomplicated  EVALUATION COMPLEXITY: Low   GOALS: Goals reviewed with patient? Yes  SHORT TERM GOALS: Target date: 12/12/2022    Will be compliant with appropriate progressive HEP Baseline: Goal status: INITIAL  2.  PROM and AAROM of surgical shoulder to be at least 150* flexion and abduction, PROM and AAROM ER/IR to be at least 45* without increase in pain  Baseline:  Goal status: INITIAL  3.  Pain in surgical shoulder to be no more than 4/10 at worst  Baseline:  Goal status: INITIAL  4.  Will be able to perform computer tasks on a Mod(I) basis with ergonomic and supportive adjustments PRN and no increase in pain  Baseline:  Goal status: INITIAL   LONG TERM GOALS: Target date: 01/09/2023    MMT surgical UE and periscap musculature  to be at least 4/5  Baseline:  Goal status: INITIAL  2.  Surgical UE AROM flexion  and abduction to be full, ER/IR AROM to be at least 60 degrees  Baseline:  Goal status: INITIAL  3.  Will be able to perform all dressing/bathing/self care tasks on an independent basis  Baseline:  Goal status: INITIAL  4.  Pain to be no more than 2/10 at worst Baseline:  Goal status: INITIAL  5.  Will tolerate light strengthening activities within protocol to assist in prep for return to sports specific tasks  Baseline:  Goal status: INITIAL  PLAN:  PT FREQUENCY:  3x/week for 4 weeks, then 2x/week for next 4 weeks   PT DURATION: 8 weeks  PLANNED INTERVENTIONS: Therapeutic exercises, Therapeutic activity, Neuromuscular re-education, Balance training, Gait training, Patient/Family education, Self Care, Joint mobilization, Aquatic Therapy, Dry Needling, Electrical stimulation, Cryotherapy, Moist heat, Taping, Ultrasound, Ionotophoresis 4mg /ml Dexamethasone, Manual therapy, and Re-evaluation  PLAN FOR NEXT SESSION: per protocol    Deniece Ree PT DPT PN2  11/18/2022, 3:06 PM

## 2022-11-21 ENCOUNTER — Encounter (HOSPITAL_BASED_OUTPATIENT_CLINIC_OR_DEPARTMENT_OTHER): Payer: Self-pay | Admitting: Physical Therapy

## 2022-11-21 ENCOUNTER — Ambulatory Visit (HOSPITAL_BASED_OUTPATIENT_CLINIC_OR_DEPARTMENT_OTHER): Payer: No Typology Code available for payment source | Admitting: Physical Therapy

## 2022-11-21 DIAGNOSIS — S43431A Superior glenoid labrum lesion of right shoulder, initial encounter: Secondary | ICD-10-CM | POA: Diagnosis not present

## 2022-11-21 DIAGNOSIS — R600 Localized edema: Secondary | ICD-10-CM

## 2022-11-21 DIAGNOSIS — M25611 Stiffness of right shoulder, not elsewhere classified: Secondary | ICD-10-CM

## 2022-11-21 DIAGNOSIS — M6281 Muscle weakness (generalized): Secondary | ICD-10-CM

## 2022-11-21 DIAGNOSIS — M25511 Pain in right shoulder: Secondary | ICD-10-CM

## 2022-11-21 NOTE — Therapy (Signed)
OUTPATIENT PHYSICAL THERAPY SHOULDER TREATMENT    Patient Name: GIANFRANCO ARAKI MRN: [id number] DOB:08-18-1971, 52 y.o., male Today's Date: 11/21/2022  END OF SESSION:  PT End of Session - 11/21/22 0909     Visit Number 3    Number of Visits 21    Date for PT Re-Evaluation 01/09/23    Authorization Type [employer]    Authorization Time Period 11/14/22 to 01/23/23    PT Start Time 0905    PT Stop Time 0940   limited by post-op precautions/protocol   PT Time Calculation (min) 35 min    Activity Tolerance Patient tolerated treatment well    Behavior During Therapy [facility] for tasks assessed/performed               Past Medical History:  Diagnosis Date   Anxiety    per patient    Cervical spine arthritis    COVID-19    ED (erectile dysfunction)    GERD (gastroesophageal reflux disease)    per patient    Hyperlipemia    per patient    Hypertension    per patient    PONV (postoperative nausea and vomiting)    Situational stress    Past Surgical History:  Procedure Laterality Date   APPENDECTOMY  2005   [place] ARTHROSCOPY WITH LABRAL REPAIR Right 11/11/2022   Procedure: RIGHT SHOULDER ARTHROSCOPY WITH LABRAL REPAIR AND LOOSE BODY REMOVAL;  Surgeon: Vanetta Mulders, MD;  Location: [place];  Service: Orthopedics;  Laterality: Right;   Patient Active Problem List   Diagnosis Date Noted   Labral tear of shoulder, right, initial encounter 11/11/2022   Cervical spine arthritis 08/22/2020   History of anxiety 08/22/2020   History of gastroesophageal reflux (GERD) 08/22/2020   Essential hypertension 08/22/2020   History of hyperlipidemia 08/22/2020    PCP: [name] MD   REFERRING PROVIDER: Vanetta Mulders, MD  REFERRING DIAG:  [phone] (ICD-10-CM) - Labral tear of shoulder, right, initial encounter    THERAPY DIAG:  Stiffness of right shoulder, not elsewhere classified  Acute pain of right shoulder  Muscle weakness  (generalized)  Localized edema  Rationale for Evaluation and Treatment: Rehabilitation  ONSET DATE: 10/15/2022  SUBJECTIVE:                                                                                                                                                                                      SUBJECTIVE STATEMENT:   Feeling good, haven't been taking pain meds except for aspirin for blood clots, also took some ibuprofen before PT this morning. Still having that spasm under my shoulder blade but  it feels good when my wife puts her finger on it. Drove myself here.   PERTINENT HISTORY: 52 y.o. male right-hand-dominant male presents with right shoulder pain and weakness now ongoing for 6 months.  At this time he has tried activity restriction and activity modification.  He has done physical therapy for strengthening of the shoulder.  He is trialed anti-inflammatories.  I did discuss that his exam is consistent with a labral tear with an intra-articular loose body.  Given the fact that there is a loose body we discussed the role for arthroscopic intervention and debridement with labral repair as needed.  I discussed the rehab associated with this.  I did also discuss an additional option would be to try steroid injection particularly as he has an upcoming trip to [place] for which she will be doing significant golfing.  I advised that this is also a possibility.  He would like to consider his options and he will call us back to let us know  PAIN:  Are you having pain? Yes: NPRS scale: 1-2/10 at rest  Pain location: R shoulder  Pain description: dull   Aggravating factors: jolts/bumping into stuff  Relieving factors: iceman, medicine  PRECAUTIONS: Other: NWB R UE, superior and inferior labral repair; PROM to tolerance per MD    WEIGHT BEARING RESTRICTIONS: Yes NWB RUE   FALLS:  Has patient fallen in last 6 months? 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 Sammuel Hines January 30th or 31st   OBJECTIVE:   DIAGNOSTIC FINDINGS:  IMPRESSION: 1. Rotator cuff tendinosis without tear. 2. Tear of the posterosuperior labrum. A multilobulated paralabral cyst extends along the posterior aspect of the glenohumeral joint extending towards the quadrilateral space measuring approximately 3.3 x 0.9 x 1.1 cm. 3. Mild degenerative changes of the [facility] joint with trace AC joint effusion. Mild pericapsular edema may be reactive or posttraumatic. 4. Trace subacromial-subdeltoid bursitis.  PROCEDURE: 1. Right shoulder superior labral repair 2. Right shoulder inferior labral repair 3. Right shoulder limited debridement  POSTOPERATIVE PLAN: He will be nonweight bearing on the right arm pending physical therapy. He will be mobilization and passive range of motion. He will be placed on aspirin for blood clot prevention  PATIENT SURVEYS:  FOTO will do 2nd session   COGNITION: Overall cognitive status: Within functional limits for tasks assessed     SENSATION: Not tested  POSTURE: Rounded shoulder, forward head   UPPER EXTREMITY ROM:   Passive ROM Right eval Left eval  Shoulder flexion 70*   Shoulder extension    Shoulder abduction 25*   Shoulder adduction    Shoulder internal rotation    Shoulder external rotation -45* limited by pain    Elbow flexion    Elbow extension    Wrist flexion    Wrist extension    Wrist ulnar deviation    Wrist radial deviation    Wrist pronation    Wrist supination    (Blank rows = not tested)  UPPER  EXTREMITY MMT:  MMT Right eval Left eval  Shoulder flexion    Shoulder extension  Shoulder abduction    Shoulder adduction    Shoulder internal rotation    Shoulder external rotation    Middle trapezius    Lower trapezius    Elbow flexion    Elbow extension    Wrist flexion    Wrist extension    Wrist ulnar deviation    Wrist radial deviation    Wrist pronation    Wrist supination    Grip strength (lbs)    (Blank rows = not tested)  DNT due to new surgery, restrictions of protocol   SHOULDER SPECIAL TESTS:   JOINT MOBILITY TESTING:    PALPATION:     TODAY'S TREATMENT:                                                                                                                                         DATE:   11/21/22  Manual  Flexion, ABD, ER, IR PROM as tolerated (as per MD): - flexion PROM to about 120* - ABD PROM to about 90*  - scaption PROM to about 130* - ER PROM to about 40* at 0* ABD - IR PROM to about 50* at 45* ABD   Education  Continue with current HEP 2-3 times per day, needs to be in sling as recommended by MD, or worst case if sling is causing neck pain can rest on couch with UE well supported by pillows- otherwise needs to be sling at all times until [facility] by MD or PT to come out of it    11/18/22  Manual  Flexion, ABD, ER PROM with no increase in pain  - Flexion PROM to 120* - ABD PROM to 90* - ER PROM to 20* at 0* ABD  TherEx  Chin tucks x10 3 seconds  Upper trap stretch 2x30 seconds B Levator stretch 2x30 seconds B  Scap retractions x12   Eval  Objective measures/assessment + education as appropriate   Changed dressings, incisions C/D/I no signs of inflammation or infection   PATIENT EDUCATION: Education details: intervention purpose  Person educated: Patient and Spouse Education method: Explanation, Media planner, and Handouts Education comprehension: verbalized understanding and returned demonstration  HOME  EXERCISE PROGRAM: Access Code: [email or web address] URL: [email or web address] Date: 11/14/2022 Prepared by: Deniece Ree  Exercises - Flexion-Extension Shoulder Pendulum with Table Support  - 1 x daily - 7 x weekly - 3 sets - 10 reps - Horizontal Shoulder Pendulum with Table Support  - 1 x daily - 7 x weekly - 3 sets - 10 reps - Seated Elbow Flexion and Extension AROM  - 1 x daily - 7 x weekly - 3 sets - 10 reps - Wrist Flexion Extension AROM with Fingers Curled and Palm Down  - 1 x daily - 7 x weekly - 3 sets - 10 reps - Seated Gripping Towel  - 1 x daily - 7 x weekly - 3 sets - 10 reps -  Thumb AROM Opposition To All Fingers  - 1 x daily - 7 x weekly - 3 sets - 10 reps  ASSESSMENT:  CLINICAL IMPRESSION:  Calum arrives today doing well, continued working on PROM as tolerated per discussion with MD on last date of service. Doing well so far, able to progress PROM somewhat this morning. Will continue efforts.   OBJECTIVE IMPAIRMENTS: decreased knowledge of condition, decreased ROM, decreased strength, hypomobility, increased edema, increased fascial restrictions, increased muscle spasms, impaired flexibility, impaired UE functional use, and pain.   ACTIVITY LIMITATIONS: carrying, lifting, bathing, toileting, dressing, self feeding, reach over head, and hygiene/grooming  PARTICIPATION LIMITATIONS: meal prep, cleaning, laundry, medication management, interpersonal relationship, driving, shopping, community activity, and occupation  PERSONAL FACTORS: Age, Behavior pattern, Education, Fitness, and Past/current experiences are also affecting patient's functional outcome.   REHAB POTENTIAL: Excellent  CLINICAL DECISION MAKING: Stable/uncomplicated  EVALUATION COMPLEXITY: Low   GOALS: Goals reviewed with patient? Yes  SHORT TERM GOALS: Target date: 12/12/2022    Will be compliant with appropriate progressive HEP Baseline: Goal status: INITIAL  2.  PROM and AAROM of surgical  shoulder to be at least 150* flexion and abduction, PROM and AAROM ER/IR to be at least 45* without increase in pain  Baseline:  Goal status: INITIAL  3.  Pain in surgical shoulder to be no more than 4/10 at worst  Baseline:  Goal status: INITIAL  4.  Will be able to perform computer tasks on a Mod(I) basis with ergonomic and supportive adjustments PRN and no increase in pain  Baseline:  Goal status: INITIAL   LONG TERM GOALS: Target date: 01/09/2023    MMT surgical UE and periscap musculature  to be at least 4/5  Baseline:  Goal status: INITIAL  2.  Surgical UE AROM flexion and abduction to be full, ER/IR AROM to be at least 60 degrees  Baseline:  Goal status: INITIAL  3.  Will be able to perform all dressing/bathing/self care tasks on an independent basis  Baseline:  Goal status: INITIAL  4.  Pain to be no more than 2/10 at worst Baseline:  Goal status: INITIAL  5.  Will tolerate light strengthening activities within protocol to assist in prep for return to sports specific tasks  Baseline:  Goal status: INITIAL  PLAN:  PT FREQUENCY:  3x/week for 4 weeks, then 2x/week for next 4 weeks   PT DURATION: 8 weeks  PLANNED INTERVENTIONS: Therapeutic exercises, Therapeutic activity, Neuromuscular re-education, Balance training, Gait training, Patient/Family education, Self Care, Joint mobilization, Aquatic Therapy, Dry Needling, Electrical stimulation, Cryotherapy, Moist heat, Taping, Ultrasound, Ionotophoresis 4mg /ml Dexamethasone, Manual therapy, and Re-evaluation  PLAN FOR NEXT SESSION: per protocol    Deniece Ree PT DPT PN2  11/21/2022, 9:57 AM

## 2022-11-24 ENCOUNTER — Encounter (HOSPITAL_BASED_OUTPATIENT_CLINIC_OR_DEPARTMENT_OTHER): Payer: Self-pay

## 2022-11-24 ENCOUNTER — Ambulatory Visit (HOSPITAL_BASED_OUTPATIENT_CLINIC_OR_DEPARTMENT_OTHER): Payer: No Typology Code available for payment source

## 2022-11-24 DIAGNOSIS — M25611 Stiffness of right shoulder, not elsewhere classified: Secondary | ICD-10-CM

## 2022-11-24 DIAGNOSIS — M6281 Muscle weakness (generalized): Secondary | ICD-10-CM

## 2022-11-24 DIAGNOSIS — R600 Localized edema: Secondary | ICD-10-CM

## 2022-11-24 DIAGNOSIS — M25511 Pain in right shoulder: Secondary | ICD-10-CM

## 2022-11-24 DIAGNOSIS — S43431A Superior glenoid labrum lesion of right shoulder, initial encounter: Secondary | ICD-10-CM | POA: Diagnosis not present

## 2022-11-24 NOTE — Therapy (Signed)
OUTPATIENT PHYSICAL THERAPY SHOULDER TREATMENT    Patient Name: Andrew Cunningham MRN: [id number] DOB:08-14-1971, 52 y.o., male Today's Date: 11/24/2022  END OF SESSION:  PT End of Session - 11/24/22 1603     Visit Number 4    Number of Visits 21    Date for PT Re-Evaluation 01/09/23    Authorization Type [employer]    Authorization Time Period 11/14/22 to 01/23/23    PT Start Time 1603    PT Stop Time 0109    PT Time Calculation (min) 40 min    Activity Tolerance Patient tolerated treatment well    Behavior During Therapy WFL for tasks assessed/performed               Past Medical History:  Diagnosis Date   Anxiety    per patient    Cervical spine arthritis    COVID-19    ED (erectile dysfunction)    GERD (gastroesophageal reflux disease)    per patient    Hyperlipemia    per patient    Hypertension    per patient    PONV (postoperative nausea and vomiting)    Situational stress    Past Surgical History:  Procedure Laterality Date   APPENDECTOMY  2005   [place] ARTHROSCOPY WITH LABRAL REPAIR Right 11/11/2022   Procedure: RIGHT SHOULDER ARTHROSCOPY WITH LABRAL REPAIR AND LOOSE BODY REMOVAL;  Surgeon: Vanetta Mulders, MD;  Location: [place];  Service: Orthopedics;  Laterality: Right;   Patient Active Problem List   Diagnosis Date Noted   Labral tear of shoulder, right, initial encounter 11/11/2022   Cervical spine arthritis 08/22/2020   History of anxiety 08/22/2020   History of gastroesophageal reflux (GERD) 08/22/2020   Essential hypertension 08/22/2020   History of hyperlipidemia 08/22/2020    PCP: [name] MD   REFERRING PROVIDER: Vanetta Mulders, MD  REFERRING DIAG:  [id number] (ICD-10-CM) - Labral tear of shoulder, right, initial encounter    THERAPY DIAG:  Stiffness of right shoulder, not elsewhere classified  Acute pain of right shoulder  Muscle weakness (generalized)  Localized edema  Rationale  for Evaluation and Treatment: Rehabilitation  ONSET DATE: 10/15/2022  SUBJECTIVE:                                                                                                                                                                                      SUBJECTIVE STATEMENT:   Pt reports no pain at entry. Has been using his hand/wrist to complete ADLS and wonders if this will affect his shoulder.   PERTINENT HISTORY: 52 y.o. male right-hand-dominant male presents with right shoulder  pain and weakness now ongoing for 6 months.  At this time he has tried activity restriction and activity modification.  He has done physical therapy for strengthening of the shoulder.  He is trialed anti-inflammatories.  I did discuss that his exam is consistent with a labral tear with an intra-articular loose body.  Given the fact that there is a loose body we discussed the role for arthroscopic intervention and debridement with labral repair as needed.  I discussed the rehab associated with this.  I did also discuss an additional option would be to try steroid injection particularly as he has an upcoming trip to [place] for which she will be doing significant golfing.  I advised that this is also a possibility.  He would like to consider his options and he will call us back to let us know  PAIN:  Are you having pain? Yes: NPRS scale: 1-2/10 at rest  Pain location: R shoulder  Pain description: dull   Aggravating factors: jolts/bumping into stuff  Relieving factors: iceman, medicine  PRECAUTIONS: Other: NWB R UE, superior and inferior labral repair; PROM to tolerance per MD    WEIGHT BEARING RESTRICTIONS: Yes NWB RUE   FALLS:  Has patient fallen in last 6 months? 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 Sammuel Hines January 30th or 31st   OBJECTIVE:   DIAGNOSTIC FINDINGS:  IMPRESSION: 1. Rotator cuff tendinosis without tear. 2. Tear of the posterosuperior labrum. A multilobulated paralabral cyst extends along the posterior aspect of the glenohumeral joint extending towards the quadrilateral space measuring approximately 3.3 x 0.9 x 1.1 cm. 3. Mild degenerative changes of the [facility] joint with trace AC joint effusion. Mild pericapsular edema may be reactive or posttraumatic. 4. Trace subacromial-subdeltoid bursitis.  PROCEDURE: 1. Right shoulder superior labral repair 2. Right shoulder inferior labral repair 3. Right shoulder limited debridement  POSTOPERATIVE PLAN: He will be nonweight bearing on the right arm pending physical therapy. He will be mobilization and passive range of motion. He will be placed on aspirin for blood clot prevention  PATIENT SURVEYS:  FOTO will do 2nd session   COGNITION: Overall cognitive status: Within functional limits for tasks assessed     SENSATION: Not tested  POSTURE: Rounded shoulder, forward head   UPPER EXTREMITY ROM:   Passive ROM Right eval Left eval  Shoulder flexion 70*   Shoulder extension    Shoulder abduction 25*   Shoulder adduction    Shoulder internal rotation    Shoulder external rotation -45* limited by pain    Elbow flexion    Elbow extension    Wrist flexion    Wrist extension    Wrist ulnar deviation    Wrist radial deviation    Wrist pronation    Wrist supination    (Blank rows = not tested)  UPPER EXTREMITY MMT:  MMT Right eval Left eval  Shoulder flexion    Shoulder extension    Shoulder abduction    Shoulder adduction    Shoulder internal rotation    Shoulder external  rotation    Middle trapezius  Lower trapezius    Elbow flexion    Elbow extension    Wrist flexion    Wrist extension    Wrist ulnar deviation    Wrist radial deviation    Wrist pronation    Wrist supination    Grip strength (lbs)    (Blank rows = not tested)  DNT due to new surgery, restrictions of protocol   SHOULDER SPECIAL TESTS:   JOINT MOBILITY TESTING:    PALPATION:     TODAY'S TREATMENT:                                                                                                                                         DATE:   11/24/22  Manual  Flexion, ABD, ER, IR PROM as tolerated (as per MD): Scapular retraction x20 Elbow flexion and extension x20   Education  Continue with current HEP 2-3 times per day, needs to be in sling as recommended by MD, or worst case if sling is causing neck pain can rest on couch with UE well supported by pillows- otherwise needs to be sling at all times until [facility] by MD or PT to come out of it   11/21/22  Manual  Flexion, ABD, ER, IR PROM as tolerated (as per MD): - flexion PROM to about 120* - ABD PROM to about 90*  - scaption PROM to about 130* - ER PROM to about 40* at 0* ABD - IR PROM to about 50* at 45* ABD   Education  Continue with current HEP 2-3 times per day, needs to be in sling as recommended by MD, or worst case if sling is causing neck pain can rest on couch with UE well supported by pillows- otherwise needs to be sling at all times until [facility] by MD or PT to come out of it    11/18/22  Manual  Flexion, ABD, ER PROM with no increase in pain  - Flexion PROM to 120* - ABD PROM to 90* - ER PROM to 20* at 0* ABD  TherEx  Chin tucks x10 3 seconds  Upper trap stretch 2x30 seconds B Levator stretch 2x30 seconds B  Scap retractions x12   Eval  Objective measures/assessment + education as appropriate   Changed dressings, incisions C/D/I no signs of inflammation or infection   PATIENT  EDUCATION: Education details: intervention purpose  Person educated: Patient and Spouse Education method: Explanation, Media planner, and Handouts Education comprehension: verbalized understanding and returned demonstration  HOME EXERCISE PROGRAM: Access Code: [email or web address] URL: [email or web address] Date: 11/14/2022 Prepared by: Deniece Ree  Exercises - Flexion-Extension Shoulder Pendulum with Table Support  - 1 x daily - 7 x weekly - 3 sets - 10 reps - Horizontal Shoulder Pendulum with Table Support  - 1 x daily - 7 x weekly - 3 sets - 10 reps - Seated Elbow Flexion and Extension AROM  - 1 x  daily - 7 x weekly - 3 sets - 10 reps - Wrist Flexion Extension AROM with Fingers Curled and Palm Down  - 1 x daily - 7 x weekly - 3 sets - 10 reps - Seated Gripping Towel  - 1 x daily - 7 x weekly - 3 sets - 10 reps - Thumb AROM Opposition To All Fingers  - 1 x daily - 7 x weekly - 3 sets - 10 reps  ASSESSMENT:  CLINICAL IMPRESSION:  Good tolerance for PROM, though he does require frequent cues for decreasing muscle guarding.  Careful to remain in pain limitations with passive range of motion.  Patient did report some discomfort in triceps with elbow extension but no pain.  Educated patient about healing timeline and protocol and expectations.  Patient verbalized understanding of precautions.  Pt has f/u with MD on Wednesday.   OBJECTIVE IMPAIRMENTS: decreased knowledge of condition, decreased ROM, decreased strength, hypomobility, increased edema, increased fascial restrictions, increased muscle spasms, impaired flexibility, impaired UE functional use, and pain.   ACTIVITY LIMITATIONS: carrying, lifting, bathing, toileting, dressing, self feeding, reach over head, and hygiene/grooming  PARTICIPATION LIMITATIONS: meal prep, cleaning, laundry, medication management, interpersonal relationship, driving, shopping, community activity, and occupation  PERSONAL FACTORS: Age, Behavior  pattern, Education, Fitness, and Past/current experiences are also affecting patient's functional outcome.   REHAB POTENTIAL: Excellent  CLINICAL DECISION MAKING: Stable/uncomplicated  EVALUATION COMPLEXITY: Low   GOALS: Goals reviewed with patient? Yes  SHORT TERM GOALS: Target date: 12/12/2022    Will be compliant with appropriate progressive HEP Baseline: Goal status: INITIAL  2.  PROM and AAROM of surgical shoulder to be at least 150* flexion and abduction, PROM and AAROM ER/IR to be at least 45* without increase in pain  Baseline:  Goal status: INITIAL  3.  Pain in surgical shoulder to be no more than 4/10 at worst  Baseline:  Goal status: INITIAL  4.  Will be able to perform computer tasks on a Mod(I) basis with ergonomic and supportive adjustments PRN and no increase in pain  Baseline:  Goal status: INITIAL   LONG TERM GOALS: Target date: 01/09/2023    MMT surgical UE and periscap musculature  to be at least 4/5  Baseline:  Goal status: INITIAL  2.  Surgical UE AROM flexion and abduction to be full, ER/IR AROM to be at least 60 degrees  Baseline:  Goal status: INITIAL  3.  Will be able to perform all dressing/bathing/self care tasks on an independent basis  Baseline:  Goal status: INITIAL  4.  Pain to be no more than 2/10 at worst Baseline:  Goal status: INITIAL  5.  Will tolerate light strengthening activities within protocol to assist in prep for return to sports specific tasks  Baseline:  Goal status: INITIAL  PLAN:  PT FREQUENCY:  3x/week for 4 weeks, then 2x/week for next 4 weeks   PT DURATION: 8 weeks  PLANNED INTERVENTIONS: Therapeutic exercises, Therapeutic activity, Neuromuscular re-education, Balance training, Gait training, Patient/Family education, Self Care, Joint mobilization, Aquatic Therapy, Dry Needling, Electrical stimulation, Cryotherapy, Moist heat, Taping, Ultrasound, Ionotophoresis 4mg /ml Dexamethasone, Manual therapy, and  Re-evaluation  PLAN FOR NEXT SESSION: per protocol    Deniece Ree PT DPT PN2  11/24/2022, 4:57 PM

## 2022-11-26 ENCOUNTER — Ambulatory Visit (INDEPENDENT_AMBULATORY_CARE_PROVIDER_SITE_OTHER): Payer: No Typology Code available for payment source | Admitting: Orthopaedic Surgery

## 2022-11-26 ENCOUNTER — Ambulatory Visit (HOSPITAL_BASED_OUTPATIENT_CLINIC_OR_DEPARTMENT_OTHER): Payer: No Typology Code available for payment source | Admitting: Physical Therapy

## 2022-11-26 ENCOUNTER — Encounter (HOSPITAL_BASED_OUTPATIENT_CLINIC_OR_DEPARTMENT_OTHER): Payer: Self-pay | Admitting: Physical Therapy

## 2022-11-26 DIAGNOSIS — M6281 Muscle weakness (generalized): Secondary | ICD-10-CM

## 2022-11-26 DIAGNOSIS — S43431A Superior glenoid labrum lesion of right shoulder, initial encounter: Secondary | ICD-10-CM | POA: Diagnosis not present

## 2022-11-26 DIAGNOSIS — M25511 Pain in right shoulder: Secondary | ICD-10-CM

## 2022-11-26 DIAGNOSIS — M25611 Stiffness of right shoulder, not elsewhere classified: Secondary | ICD-10-CM

## 2022-11-26 DIAGNOSIS — R600 Localized edema: Secondary | ICD-10-CM

## 2022-11-26 NOTE — Therapy (Signed)
OUTPATIENT PHYSICAL THERAPY SHOULDER TREATMENT    Patient Name: Andrew Cunningham MRN: [id number] DOB:1971-09-16, 52 y.o., male Today's Date: 11/26/2022  END OF SESSION:  PT End of Session - 11/26/22 1003     Visit Number 5    Number of Visits 21    Date for PT Re-Evaluation 01/09/23    Authorization Type [employer]et[employer]    Authorization Time Period 11/14/22 to 01/23/23               Past Medical History:  Diagnosis Date   Anxiety    per patient    Cervical spine arthritis    COVID-19    ED (erectile dysfunction)    GERD (gastroesophageal reflux disease)    per patient    Hyperlipemia    per patient    Hypertension    per patient    PONV (postoperative nausea and vomiting)    Situational stress    Past Surgical History:  Procedure Laterality Date   APPENDECTOMY  2005   [place]   SHOULDER ARTHROSCOPY WITH LABRAL REPAIR Right 11/11/2022   Procedure: RIGHT SHOULDER ARTHROSCOPY WITH LABRAL REPAIR AND LOOSE BODY REMOVAL;  Surgeon: Vanetta Mulders, MD;  Location: [place];  Service: Orthopedics;  Laterality: Right;   Patient Active Problem List   Diagnosis Date Noted   Labral tear of shoulder, right, initial encounter 11/11/2022   Cervical spine arthritis 08/22/2020   History of anxiety 08/22/2020   History of gastroesophageal reflux (GERD) 08/22/2020   Essential hypertension 08/22/2020   History of hyperlipidemia 08/22/2020    PCP: [name] MD   REFERRING PROVIDER: Vanetta Mulders, MD  REFERRING DIAG:  [phone] (ICD-10-CM) - Labral tear of shoulder, right, initial encounter    THERAPY DIAG:  No diagnosis found.  Rationale for Evaluation and Treatment: Rehabilitation  ONSET DATE: 10/15/2022  SUBJECTIVE:                                                                                                                                                                                      SUBJECTIVE STATEMENT:   Pt reports no pain  at entry. Has been using his hand/wrist to complete ADLS and wonders if this will affect his shoulder.   PERTINENT HISTORY: 52 y.o. male right-hand-dominant male presents with right shoulder pain and weakness now ongoing for 6 months.  At this time he has tried activity restriction and activity modification.  He has done physical therapy for strengthening of the shoulder.  He is trialed anti-inflammatories.  I did discuss that his exam is consistent with a labral tear with an intra-articular loose body.  Given the fact that  there is a loose body we discussed the role for arthroscopic intervention and debridement with labral repair as needed.  I discussed the rehab associated with this.  I did also discuss an additional option would be to try steroid injection particularly as he has an upcoming trip to [place] for which she will be doing significant golfing.  I advised that this is also a possibility.  He would like to consider his options and he will call us back to let us know  PAIN:  Are you having pain? Yes: NPRS scale: 1-2/10 at rest  Pain location: R shoulder  Pain description: dull   Aggravating factors: jolts/bumping into stuff  Relieving factors: iceman, medicine  PRECAUTIONS: Other: NWB R UE, superior and inferior labral repair; PROM to tolerance per MD    WEIGHT BEARING RESTRICTIONS: Yes NWB RUE   FALLS:  Has patient fallen in last 6 months? 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 Sammuel Hines January 30th or 31st   OBJECTIVE:   DIAGNOSTIC FINDINGS:   IMPRESSION: 1. Rotator cuff tendinosis without tear. 2. Tear of the posterosuperior labrum. A multilobulated paralabral cyst extends along the posterior aspect of the glenohumeral joint extending towards the quadrilateral space measuring approximately 3.3 x 0.9 x 1.1 cm. 3. Mild degenerative changes of the [facility] joint with trace AC joint effusion. Mild pericapsular edema may be reactive or posttraumatic. 4. Trace subacromial-subdeltoid bursitis.  PROCEDURE: 1. Right shoulder superior labral repair 2. Right shoulder inferior labral repair 3. Right shoulder limited debridement  POSTOPERATIVE PLAN: He will be nonweight bearing on the right arm pending physical therapy. He will be mobilization and passive range of motion. He will be placed on aspirin for blood clot prevention  PATIENT SURVEYS:  FOTO will do 2nd session   COGNITION: Overall cognitive status: Within functional limits for tasks assessed     SENSATION: Not tested  POSTURE: Rounded shoulder, forward head   UPPER EXTREMITY ROM:   Passive ROM Right eval Left eval  Shoulder flexion 70*   Shoulder extension    Shoulder abduction 25*   Shoulder adduction    Shoulder internal rotation    Shoulder external rotation -45* limited by pain    Elbow flexion    Elbow extension    Wrist flexion    Wrist extension    Wrist ulnar deviation    Wrist radial deviation    Wrist pronation    Wrist supination    (Blank rows = not tested)  UPPER EXTREMITY MMT:  MMT Right eval Left eval  Shoulder flexion    Shoulder extension    Shoulder abduction    Shoulder adduction    Shoulder internal rotation    Shoulder external rotation    Middle trapezius    Lower trapezius    Elbow flexion    Elbow extension    Wrist flexion    Wrist extension    Wrist ulnar deviation    Wrist radial deviation    Wrist pronation    Wrist supination    Grip strength (lbs)    (Blank rows = not tested)  DNT due to new surgery,  restrictions of protocol   SHOULDER SPECIAL TESTS:   JOINT MOBILITY TESTING:    PALPATION:     TODAY'S TREATMENT:                                                                                                                                         DATE:  1/31      Manual: Flexion, ABD, ER, IR PROM as tolerated (as per MD): Scapular retraction x20 Self flexion with right arm with cuing not to pull into abduction  Self ER with cuing not to push to end range  Reviewed concepts for coming out of the sling    11/24/22  Manual  Flexion, ABD, ER, IR PROM as tolerated (as per MD): Scapular retraction x20 Elbow flexion and extension x20   Education  Continue with current HEP 2-3 times per day, needs to be in sling as recommended by MD, or worst case if sling is causing neck pain can rest on couch with UE well supported by pillows- otherwise needs to be sling at all times until [facility] by MD or PT to come out of it   11/21/22  Manual  Flexion, ABD, ER, IR PROM as tolerated (as per MD): - flexion PROM to about 120* - ABD PROM to about 90*  - scaption PROM to about 130* - ER PROM to about 40* at 0* ABD - IR PROM to about 50* at 45* ABD   Education  Continue with current HEP 2-3 times per day, needs to be in sling as recommended by MD, or worst case if sling is causing neck pain can rest on couch with UE well supported by pillows- otherwise needs to be sling at all times until [facility] by MD or PT to come out of it    11/18/22  Manual  Flexion, ABD, ER PROM with no increase in pain  - Flexion PROM to 120* - ABD PROM to 90* - ER PROM to 20* at 0* ABD  TherEx  Chin tucks x10 3 seconds  Upper trap stretch 2x30 seconds B Levator stretch 2x30 seconds B  Scap retractions x12   Eval  Objective measures/assessment + education as appropriate   Changed dressings, incisions C/D/I no signs of inflammation or infection   PATIENT EDUCATION: Education details:  intervention purpose  Person educated: Patient and Spouse Education method: Explanation, Media planner, and Handouts Education comprehension: verbalized understanding and returned demonstration  HOME EXERCISE PROGRAM: Access Code: [email or web address] URL: [email or web address] Date: 11/14/2022 Prepared by: Deniece Ree  Exercises - Flexion-Extension Shoulder Pendulum with Table Support  - 1 x daily - 7 x weekly - 3 sets - 10 reps - Horizontal Shoulder Pendulum with Table Support  - 1 x daily - 7 x weekly - 3 sets - 10 reps - Seated Elbow Flexion and Extension AROM  - 1 x daily - 7 x  weekly - 3 sets - 10 reps - Wrist Flexion Extension AROM with Fingers Curled and Palm Down  - 1 x daily - 7 x weekly - 3 sets - 10 reps - Seated Gripping Towel  - 1 x daily - 7 x weekly - 3 sets - 10 reps - Thumb AROM Opposition To All Fingers  - 1 x daily - 7 x weekly - 3 sets - 10 reps  ASSESSMENT:  CLINICAL IMPRESSION: The patient has seen the MD> The MD is happy with his progress. We reviewed self stretching techniques at home per protocol. We will likely get into sling isometrics next visit. His flexion was a little more limited today, but he slept on the arm last night.  OBJECTIVE IMPAIRMENTS: decreased knowledge of condition, decreased ROM, decreased strength, hypomobility, increased edema, increased fascial restrictions, increased muscle spasms, impaired flexibility, impaired UE functional use, and pain.   ACTIVITY LIMITATIONS: carrying, lifting, bathing, toileting, dressing, self feeding, reach over head, and hygiene/grooming  PARTICIPATION LIMITATIONS: meal prep, cleaning, laundry, medication management, interpersonal relationship, driving, shopping, community activity, and occupation  PERSONAL FACTORS: Age, Behavior pattern, Education, Fitness, and Past/current experiences are also affecting patient's functional outcome.   REHAB POTENTIAL: Excellent  CLINICAL DECISION MAKING:  Stable/uncomplicated  EVALUATION COMPLEXITY: Low   GOALS: Goals reviewed with patient? Yes  SHORT TERM GOALS: Target date: 12/12/2022    Will be compliant with appropriate progressive HEP Baseline: Goal status: INITIAL  2.  PROM and AAROM of surgical shoulder to be at least 150* flexion and abduction, PROM and AAROM ER/IR to be at least 45* without increase in pain  Baseline:  Goal status: INITIAL  3.  Pain in surgical shoulder to be no more than 4/10 at worst  Baseline:  Goal status: INITIAL  4.  Will be able to perform computer tasks on a Mod(I) basis with ergonomic and supportive adjustments PRN and no increase in pain  Baseline:  Goal status: INITIAL   LONG TERM GOALS: Target date: 01/09/2023    MMT surgical UE and periscap musculature  to be at least 4/5  Baseline:  Goal status: INITIAL  2.  Surgical UE AROM flexion and abduction to be full, ER/IR AROM to be at least 60 degrees  Baseline:  Goal status: INITIAL  3.  Will be able to perform all dressing/bathing/self care tasks on an independent basis  Baseline:  Goal status: INITIAL  4.  Pain to be no more than 2/10 at worst Baseline:  Goal status: INITIAL  5.  Will tolerate light strengthening activities within protocol to assist in prep for return to sports specific tasks  Baseline:  Goal status: INITIAL  PLAN:  PT FREQUENCY:  3x/week for 4 weeks, then 2x/week for next 4 weeks   PT DURATION: 8 weeks  PLANNED INTERVENTIONS: Therapeutic exercises, Therapeutic activity, Neuromuscular re-education, Balance training, Gait training, Patient/Family education, Self Care, Joint mobilization, Aquatic Therapy, Dry Needling, Electrical stimulation, Cryotherapy, Moist heat, Taping, Ultrasound, Ionotophoresis 4mg /ml Dexamethasone, Manual therapy, and Re-evaluation  PLAN FOR NEXT SESSION: per protocol    Carolyne Littles PT  11/26/2022, 10:41 AM

## 2022-11-26 NOTE — Progress Notes (Signed)
Post Operative Evaluation    Procedure/Date of Surgery: Right shoulder labral repair 1/16  Interval History:    Presents today for follow-up for right shoulder labral repair 2 weeks out.  Overall he is doing extremely well.  He does have some medial scapular pain.  He has been compliant with sling usage.  Here today for further assessment.   PMH/PSH/Family History/Social History/Meds/Allergies:    Past Medical History:  Diagnosis Date   Anxiety    per patient    Cervical spine arthritis    COVID-19    ED (erectile dysfunction)    GERD (gastroesophageal reflux disease)    per patient    Hyperlipemia    per patient    Hypertension    per patient    PONV (postoperative nausea and vomiting)    Situational stress    Past Surgical History:  Procedure Laterality Date   APPENDECTOMY  2005   [place]   SHOULDER ARTHROSCOPY WITH LABRAL REPAIR Right 11/11/2022   Procedure: RIGHT SHOULDER ARTHROSCOPY WITH LABRAL REPAIR AND LOOSE BODY REMOVAL;  Surgeon: Vanetta Mulders, MD;  Location: [facility];  Service: Orthopedics;  Laterality: Right;   Social History   Socioeconomic History   Marital status: Married    Spouse name: Not on file   Number of children: Not on file   Years of education: Not on file   Highest education level: Not on file  Occupational History   Not on file  Tobacco Use   Smoking status: Former   Smokeless tobacco: Former    Types: Nurse, children's Use: Never used  Substance and Sexual Activity   Alcohol use: Yes    Comment: 2 drinks daily    Drug use: Not Currently   Sexual activity: Not on file  Other Topics Concern   Not on file  Social History Narrative   Not on file   Social Determinants of Health   Financial Resource Strain: Not on file  Food Insecurity: Not on file  Transportation Needs: Not on file  Physical Activity: Not on file  Stress: Not on file  Social  Connections: Not on file   Family History  Problem Relation Age of Onset   Healthy Mother    Arthritis Mother    Healthy Father    Healthy Sister    Healthy Daughter    No Known Allergies Current Outpatient Medications  Medication Sig Dispense Refill   ALPRAZolam (XANAX) 0.5 MG tablet Take 0.5 mg by mouth daily as needed.     aspirin EC 325 MG tablet Take 1 tablet (325 mg total) by mouth daily. 30 tablet 0   calcium-vitamin D (OSCAL WITH D) 500-5 MG-MCG tablet Take 1 tablet by mouth.     cetirizine (ZYRTEC) 10 MG tablet Take 10 mg by mouth daily.     influenza vac split quadrivalent PF (FLUARIX) 0.5 ML injection Inject into the muscle. 0.5 mL 0   losartan (COZAAR) 50 MG tablet Take 50 mg by mouth daily.     omeprazole (PRILOSEC) 10 MG capsule Take 10 mg by mouth daily.     oxyCODONE (ROXICODONE) 5 MG immediate release tablet Take 1 tablet (5 mg total) by mouth every 4 (four) hours as needed for severe pain or breakthrough pain. [place]  tablet 0   propranolol (INDERAL) 20 MG tablet Take 20 mg by mouth as needed.     rosuvastatin (CRESTOR) 5 MG tablet Take 5 mg by mouth daily.     vitamin B-12 (CYANOCOBALAMIN) 100 MCG tablet Take 100 mcg by mouth daily.     No current facility-administered medications for this visit.   No results found.  Review of Systems:   A ROS was performed including pertinent positives and negatives as documented in the HPI.   Musculoskeletal Exam:     Right shoulder incisions are well-appearing without erythema or drainage.  In the supine position he is able to forward elevate to 90 degrees and externally rotate to 40 degrees.  Neither of these cause pain.  Internal rotation deferred today.  Distal neurosensory exam is intact  Imaging:      I personally reviewed and interpreted the radiographs.   Assessment:   2 weeks status post right shoulder posterior labral repair overall doing extremely well.  At this time we will continue to advance according to the  repair protocol.  I will see him back in 3 weeks and plan to advance his active range of motion at that time.  Plan :    -Return to clinic in 3 weeks for reassessment      I personally saw and evaluated the patient, and participated in the management and treatment plan.  Vanetta Mulders, MD Attending Physician, Orthopedic Surgery  This document was dictated using Dragon voice recognition software. A reasonable attempt at proof reading has been made to minimize errors.

## 2022-11-28 ENCOUNTER — Ambulatory Visit (HOSPITAL_BASED_OUTPATIENT_CLINIC_OR_DEPARTMENT_OTHER): Payer: No Typology Code available for payment source | Attending: Orthopaedic Surgery

## 2022-11-28 ENCOUNTER — Encounter (HOSPITAL_BASED_OUTPATIENT_CLINIC_OR_DEPARTMENT_OTHER): Payer: Self-pay

## 2022-11-28 DIAGNOSIS — M6281 Muscle weakness (generalized): Secondary | ICD-10-CM | POA: Insufficient documentation

## 2022-11-28 DIAGNOSIS — R600 Localized edema: Secondary | ICD-10-CM | POA: Insufficient documentation

## 2022-11-28 DIAGNOSIS — M25511 Pain in right shoulder: Secondary | ICD-10-CM | POA: Insufficient documentation

## 2022-11-28 DIAGNOSIS — M25611 Stiffness of right shoulder, not elsewhere classified: Secondary | ICD-10-CM | POA: Insufficient documentation

## 2022-11-28 NOTE — Therapy (Signed)
OUTPATIENT PHYSICAL THERAPY SHOULDER TREATMENT    Patient Name: Andrew Cunningham MRN: [id number] DOB:[birth year], 52 y.o., male Today's Date: 11/28/2022  END OF SESSION:  PT End of Session - 11/28/22 1106     Visit Number 6    Number of Visits 21    Date for PT Re-Evaluation 01/09/23    Authorization Type [employer]    Authorization Time Period 11/14/22 to 01/23/23    PT Start Time 1015    PT Stop Time 1100    PT Time Calculation (min) 45 min    Activity Tolerance Patient tolerated treatment well    Behavior During Therapy WFL for tasks assessed/performed                Past Medical History:  Diagnosis Date   Anxiety    per patient    Cervical spine arthritis    COVID-19    ED (erectile dysfunction)    GERD (gastroesophageal reflux disease)    per patient    Hyperlipemia    per patient    Hypertension    per patient    PONV (postoperative nausea and vomiting)    Situational stress    Past Surgical History:  Procedure Laterality Date   APPENDECTOMY  2005   [place] ARTHROSCOPY WITH LABRAL REPAIR Right 11/11/2022   Procedure: RIGHT SHOULDER ARTHROSCOPY WITH LABRAL REPAIR AND LOOSE BODY REMOVAL;  Surgeon: Vanetta Mulders, MD;  Location: [facility];  Service: Orthopedics;  Laterality: Right;   Patient Active Problem List   Diagnosis Date Noted   Labral tear of shoulder, right, initial encounter 11/11/2022   Cervical spine arthritis 08/22/2020   History of anxiety 08/22/2020   History of gastroesophageal reflux (GERD) 08/22/2020   Essential hypertension 08/22/2020   History of hyperlipidemia 08/22/2020    PCP: [name] MD   REFERRING PROVIDER: Vanetta Mulders, MD  REFERRING DIAG:  [phone] (ICD-10-CM) - Labral tear of shoulder, right, initial encounter    THERAPY DIAG:  Stiffness of right shoulder, not elsewhere classified  Acute pain of right shoulder  Muscle weakness (generalized)  Localized  edema  Rationale for Evaluation and Treatment: Rehabilitation  ONSET DATE: 10/15/2022  SUBJECTIVE:                                                                                                                                                                                      SUBJECTIVE STATEMENT:   Pt reports 2-3/10 pain level at entry. Has been able to sleep in his bed now.   PERTINENT HISTORY: 52 y.o. male right-hand-dominant male presents with right shoulder pain and weakness now ongoing  for 6 months.  At this time he has tried activity restriction and activity modification.  He has done physical therapy for strengthening of the shoulder.  He is trialed anti-inflammatories.  I did discuss that his exam is consistent with a labral tear with an intra-articular loose body.  Given the fact that there is a loose body we discussed the role for arthroscopic intervention and debridement with labral repair as needed.  I discussed the rehab associated with this.  I did also discuss an additional option would be to try steroid injection particularly as he has an upcoming trip to [place] for which she will be doing significant golfing.  I advised that this is also a possibility.  He would like to consider his options and he will call us back to let us know  PAIN:  Are you having pain? Yes: NPRS scale: 1-2/10 at rest  Pain location: R shoulder  Pain description: dull   Aggravating factors: jolts/bumping into stuff  Relieving factors: iceman, medicine  PRECAUTIONS: Other: NWB R UE, superior and inferior labral repair; PROM to tolerance per MD    WEIGHT BEARING RESTRICTIONS: Yes NWB RUE   FALLS:  Has patient fallen in last 6 months? 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 Sammuel Hines January 30th or 31st   OBJECTIVE:   DIAGNOSTIC FINDINGS:  IMPRESSION: 1. Rotator cuff tendinosis without tear. 2. Tear of the posterosuperior labrum. A multilobulated paralabral cyst extends along the posterior aspect of the glenohumeral joint extending towards the quadrilateral space measuring approximately 3.3 x 0.9 x 1.1 cm. 3. Mild degenerative changes of the [facility] joint with trace AC joint effusion. Mild pericapsular edema may be reactive or posttraumatic. 4. Trace subacromial-subdeltoid bursitis.  PROCEDURE: 1. Right shoulder superior labral repair 2. Right shoulder inferior labral repair 3. Right shoulder limited debridement  POSTOPERATIVE PLAN: He will be nonweight bearing on the right arm pending physical therapy. He will be mobilization and passive range of motion. He will be placed on aspirin for blood clot prevention  PATIENT SURVEYS:  FOTO will do 2nd session   COGNITION: Overall cognitive status: Within functional limits for tasks assessed     SENSATION: Not tested  POSTURE: Rounded shoulder, forward head   UPPER EXTREMITY ROM:   Passive ROM Right eval Left eval  Shoulder flexion 70*   Shoulder extension    Shoulder abduction 25*   Shoulder adduction    Shoulder internal rotation    Shoulder external rotation -45* limited by pain    Elbow flexion    Elbow extension    Wrist flexion    Wrist extension    Wrist ulnar deviation    Wrist radial deviation    Wrist pronation    Wrist supination    (Blank rows = not tested)  UPPER EXTREMITY MMT:  MMT Right eval Left eval  Shoulder flexion    Shoulder extension    Shoulder abduction    Shoulder adduction    Shoulder internal rotation    Shoulder external rotation    Middle  trapezius    Lower trapezius  Elbow flexion    Elbow extension    Wrist flexion    Wrist extension    Wrist ulnar deviation    Wrist radial deviation    Wrist pronation    Wrist supination    Grip strength (lbs)    (Blank rows = not tested)  DNT due to new surgery, restrictions of protocol   SHOULDER SPECIAL TESTS:   JOINT MOBILITY TESTING:    PALPATION:     TODAY'S TREATMENT:                                                                                                                                         DATE:   2/2  Manual: Flexion, ABD, ER, IR PROM as tolerated (as per MD): Scapular retraction x20 Self flexion with right arm with cuing not to pull into abduction  Self ER with cuing not to push to end range  Elbow flexion/extension x20   1/31  Manual: Flexion, ABD, ER, IR PROM as tolerated (as per MD): Scapular retraction x20 Self flexion with right arm with cuing not to pull into abduction  Self ER with cuing not to push to end range  Reviewed concepts for coming out of the sling  Education  Continue with current HEP 2-3 times per day, needs to be in sling as recommended by MD, or worst case if sling is causing neck pain can rest on couch with UE well supported by pillows- otherwise needs to be sling at all times until [facility] by MD or PT to come out of it   11/21/22  Manual  Flexion, ABD, ER, IR PROM as tolerated (as per MD): - flexion PROM to about 120* - ABD PROM to about 90*  - scaption PROM to about 130* - ER PROM to about 40* at 0* ABD - IR PROM to about 50* at 45* ABD   Education  Continue with current HEP 2-3 times per day, needs to be in sling as recommended by MD, or worst case if sling is causing neck pain can rest on couch with UE well supported by pillows- otherwise needs to be sling at all times until [facility] by MD or PT to come out of it     PATIENT EDUCATION: Education details: intervention purpose  Person educated: Patient and  Spouse Education method: Explanation, Demonstration, and Handouts Education comprehension: verbalized understanding and returned demonstration  HOME EXERCISE PROGRAM: Access Code: [email or web address] URL: [email or web address] Date: 11/14/2022 Prepared by: Deniece Ree  Exercises - Flexion-Extension Shoulder Pendulum with Table Support  - 1 x daily - 7 x weekly - 3 sets - 10 reps - Horizontal Shoulder Pendulum with Table Support  - 1 x daily - 7 x weekly - 3 sets - 10 reps - Seated Elbow Flexion and Extension AROM  - 1 x daily - 7 x weekly - 3 sets - 10 reps - Wrist Flexion  Extension AROM with Fingers Curled and Palm Down  - 1 x daily - 7 x weekly - 3 sets - 10 reps - Seated Gripping Towel  - 1 x daily - 7 x weekly - 3 sets - 10 reps - Thumb AROM Opposition To All Fingers  - 1 x daily - 7 x weekly - 3 sets - 10 reps  ASSESSMENT:  CLINICAL IMPRESSION: Pt reports he has been staying out of the sling at home most of the time, states MD allowed this. PROM performed today within pain limitations. Some verbal cues for decreasing muscle guarding were required. Reviewed precautions an healing timeline.  He denied pain at end of session. Instructed pt to use ice prn.  OBJECTIVE IMPAIRMENTS: decreased knowledge of condition, decreased ROM, decreased strength, hypomobility, increased edema, increased fascial restrictions, increased muscle spasms, impaired flexibility, impaired UE functional use, and pain.   ACTIVITY LIMITATIONS: carrying, lifting, bathing, toileting, dressing, self feeding, reach over head, and hygiene/grooming  PARTICIPATION LIMITATIONS: meal prep, cleaning, laundry, medication management, interpersonal relationship, driving, shopping, community activity, and occupation  PERSONAL FACTORS: Age, Behavior pattern, Education, Fitness, and Past/current experiences are also affecting patient's functional outcome.   REHAB POTENTIAL: Excellent  CLINICAL DECISION MAKING:  Stable/uncomplicated  EVALUATION COMPLEXITY: Low   GOALS: Goals reviewed with patient? Yes  SHORT TERM GOALS: Target date: 12/12/2022    Will be compliant with appropriate progressive HEP Baseline: Goal status: INITIAL  2.  PROM and AAROM of surgical shoulder to be at least 150* flexion and abduction, PROM and AAROM ER/IR to be at least 45* without increase in pain  Baseline:  Goal status: INITIAL  3.  Pain in surgical shoulder to be no more than 4/10 at worst  Baseline:  Goal status: INITIAL  4.  Will be able to perform computer tasks on a Mod(I) basis with ergonomic and supportive adjustments PRN and no increase in pain  Baseline:  Goal status: INITIAL   LONG TERM GOALS: Target date: 01/09/2023    MMT surgical UE and periscap musculature  to be at least 4/5  Baseline:  Goal status: INITIAL  2.  Surgical UE AROM flexion and abduction to be full, ER/IR AROM to be at least 60 degrees  Baseline:  Goal status: INITIAL  3.  Will be able to perform all dressing/bathing/self care tasks on an independent basis  Baseline:  Goal status: INITIAL  4.  Pain to be no more than 2/10 at worst Baseline:  Goal status: INITIAL  5.  Will tolerate light strengthening activities within protocol to assist in prep for return to sports specific tasks  Baseline:  Goal status: INITIAL  PLAN:  PT FREQUENCY:  3x/week for 4 weeks, then 2x/week for next 4 weeks   PT DURATION: 8 weeks  PLANNED INTERVENTIONS: Therapeutic exercises, Therapeutic activity, Neuromuscular re-education, Balance training, Gait training, Patient/Family education, Self Care, Joint mobilization, Aquatic Therapy, Dry Needling, Electrical stimulation, Cryotherapy, Moist heat, Taping, Ultrasound, Ionotophoresis 4mg /ml Dexamethasone, Manual therapy, and Re-evaluation  PLAN FOR NEXT SESSION: per protocol    Sherlynn Carbon, PTA   11/28/2022, 11:08 AM

## 2022-12-02 ENCOUNTER — Encounter (HOSPITAL_BASED_OUTPATIENT_CLINIC_OR_DEPARTMENT_OTHER): Payer: Self-pay | Admitting: Physical Therapy

## 2022-12-02 ENCOUNTER — Ambulatory Visit (HOSPITAL_BASED_OUTPATIENT_CLINIC_OR_DEPARTMENT_OTHER): Payer: No Typology Code available for payment source | Admitting: Physical Therapy

## 2022-12-02 DIAGNOSIS — M25511 Pain in right shoulder: Secondary | ICD-10-CM

## 2022-12-02 DIAGNOSIS — M25611 Stiffness of right shoulder, not elsewhere classified: Secondary | ICD-10-CM

## 2022-12-02 DIAGNOSIS — M6281 Muscle weakness (generalized): Secondary | ICD-10-CM

## 2022-12-02 DIAGNOSIS — R600 Localized edema: Secondary | ICD-10-CM

## 2022-12-02 NOTE — Therapy (Signed)
OUTPATIENT PHYSICAL THERAPY SHOULDER TREATMENT    Patient Name: Andrew Cunningham MRN: [id number] DOB:Dec 26, 1970, 52 y.o., male Today's Date: 11/28/2022  END OF SESSION:  PT End of Session - 11/28/22 1106     Visit Number 6    Number of Visits 21    Date for PT Re-Evaluation 01/09/23    Authorization Type [employer]    Authorization Time Period 11/14/22 to 01/23/23    PT Start Time 1015    PT Stop Time 1100    PT Time Calculation (min) 45 min    Activity Tolerance Patient tolerated treatment well    Behavior During Therapy WFL for tasks assessed/performed                Past Medical History:  Diagnosis Date   Anxiety    per patient    Cervical spine arthritis    COVID-19    ED (erectile dysfunction)    GERD (gastroesophageal reflux disease)    per patient    Hyperlipemia    per patient    Hypertension    per patient    PONV (postoperative nausea and vomiting)    Situational stress    Past Surgical History:  Procedure Laterality Date   APPENDECTOMY  2005   [place] ARTHROSCOPY WITH LABRAL REPAIR Right 11/11/2022   Procedure: RIGHT SHOULDER ARTHROSCOPY WITH LABRAL REPAIR AND LOOSE BODY REMOVAL;  Surgeon: Vanetta Mulders, MD;  Location: [place];  Service: Orthopedics;  Laterality: Right;   Patient Active Problem List   Diagnosis Date Noted   Labral tear of shoulder, right, initial encounter 11/11/2022   Cervical spine arthritis 08/22/2020   History of anxiety 08/22/2020   History of gastroesophageal reflux (GERD) 08/22/2020   Essential hypertension 08/22/2020   History of hyperlipidemia 08/22/2020    PCP: [name] MD   REFERRING PROVIDER: Vanetta Mulders, MD  REFERRING DIAG:  [phone] (ICD-10-CM) - Labral tear of shoulder, right, initial encounter    THERAPY DIAG:  Stiffness of right shoulder, not elsewhere classified  Acute pain of right shoulder  Muscle weakness (generalized)  Localized  edema  Rationale for Evaluation and Treatment: Rehabilitation  ONSET DATE: 10/15/2022  SUBJECTIVE:                                                                                                                                                                                      SUBJECTIVE STATEMENT: The patient has been sleeping in his bed. He is doing well. He didn't get very sore after the last visit.    PERTINENT HISTORY: 52 y.o. male right-hand-dominant male presents with right shoulder pain  and weakness now ongoing for 6 months.  At this time he has tried activity restriction and activity modification.  He has done physical therapy for strengthening of the shoulder.  He is trialed anti-inflammatories.  I did discuss that his exam is consistent with a labral tear with an intra-articular loose body.  Given the fact that there is a loose body we discussed the role for arthroscopic intervention and debridement with labral repair as needed.  I discussed the rehab associated with this.  I did also discuss an additional option would be to try steroid injection particularly as he has an upcoming trip to [place] for which she will be doing significant golfing.  I advised that this is also a possibility.  He would like to consider his options and he will call us back to let us know  PAIN:  Are you having pain? Yes: NPRS scale: 1-2/10 at rest  Pain location: R shoulder  Pain description: dull   Aggravating factors: jolts/bumping into stuff  Relieving factors: iceman, medicine  PRECAUTIONS: Other: NWB R UE, superior and inferior labral repair; PROM to tolerance per MD    WEIGHT BEARING RESTRICTIONS: Yes NWB RUE   FALLS:  Has patient fallen in last 6 months? 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 Sammuel Hines January 30th or 31st   OBJECTIVE:   DIAGNOSTIC FINDINGS:  IMPRESSION: 1. Rotator cuff tendinosis without tear. 2. Tear of the posterosuperior labrum. A multilobulated paralabral cyst extends along the posterior aspect of the glenohumeral joint extending towards the quadrilateral space measuring approximately 3.3 x 0.9 x 1.1 cm. 3. Mild degenerative changes of the [facility] joint with trace AC joint effusion. Mild pericapsular edema may be reactive or posttraumatic. 4. Trace subacromial-subdeltoid bursitis.  PROCEDURE: 1. Right shoulder superior labral repair 2. Right shoulder inferior labral repair 3. Right shoulder limited debridement  POSTOPERATIVE PLAN: He will be nonweight bearing on the right arm pending physical therapy. He will be mobilization and passive range of motion. He will be placed on aspirin for blood clot prevention  PATIENT SURVEYS:  FOTO will do 2nd session   COGNITION: Overall cognitive status: Within functional limits for tasks assessed     SENSATION: Not tested  POSTURE: Rounded shoulder, forward head   UPPER EXTREMITY ROM:   Passive ROM Right eval Left eval  Shoulder flexion 70*   Shoulder extension    Shoulder abduction 25*   Shoulder adduction    Shoulder internal rotation    Shoulder external rotation -45* limited by pain    Elbow flexion    Elbow extension    Wrist flexion    Wrist extension    Wrist ulnar deviation    Wrist radial deviation    Wrist pronation    Wrist supination    (Blank rows = not tested)  UPPER EXTREMITY MMT:  MMT Right eval Left eval  Shoulder flexion    Shoulder extension    Shoulder abduction    Shoulder adduction    Shoulder internal rotation    Shoulder external  rotation    Middle trapezius  Lower trapezius    Elbow flexion    Elbow extension    Wrist flexion    Wrist extension    Wrist ulnar deviation    Wrist radial deviation    Wrist pronation    Wrist supination    Grip strength (lbs)    (Blank rows = not tested)  DNT due to new surgery, restrictions of protocol   SHOULDER SPECIAL TESTS:   JOINT MOBILITY TESTING:    PALPATION:     TODAY'S TREATMENT:                                                                                                                                         DATE:  2/6 - Manual: Flexion, ABD, ER, IR PROM as tolerated (as per MD); trigger point release to upper trap grade 1 and 2 inferior and posterior glides - active ER 2x20  - Elbow flexion/ extension x20  - pulleys 2 min  - elbow flexion/ extension x20  - self flexion x20 in supine - scap retraction x20   2/2  Manual: Flexion, ABD, ER, IR PROM as tolerated (as per MD): Scapular retraction x20 Self flexion with right arm with cuing not to pull into abduction  Self ER with cuing not to push to end range  Elbow flexion/extension x20     PATIENT EDUCATION: Education details: intervention purpose  Person educated: Patient and Spouse Education method: Explanation, Demonstration, and Handouts Education comprehension: verbalized understanding and returned demonstration  HOME EXERCISE PROGRAM: Access Code: [email or web address] URL: [email or web address] Date: 11/14/2022 Prepared by: Deniece Ree  Exercises - Flexion-Extension Shoulder Pendulum with Table Support  - 1 x daily - 7 x weekly - 3 sets - 10 reps - Horizontal Shoulder Pendulum with Table Support  - 1 x daily - 7 x weekly - 3 sets - 10 reps - Seated Elbow Flexion and Extension AROM  - 1 x daily - 7 x weekly - 3 sets - 10 reps - Wrist Flexion Extension AROM with Fingers Curled and Palm Down  - 1 x daily - 7 x weekly - 3 sets - 10 reps - Seated Gripping Towel  - 1 x daily - 7 x  weekly - 3 sets - 10 reps - Thumb AROM Opposition To All Fingers  - 1 x daily - 7 x weekly - 3 sets - 10 reps  ASSESSMENT:  CLINICAL IMPRESSION: The patient is making great progress. He will be at 4 weeks next week. We will add scap strengthening and isometrics at that point. The patients range is progressing very well.    OBJECTIVE IMPAIRMENTS: decreased knowledge of condition, decreased ROM, decreased strength, hypomobility, increased edema, increased fascial restrictions, increased muscle spasms, impaired flexibility, impaired UE functional use, and pain.   ACTIVITY LIMITATIONS: carrying, lifting, bathing, toileting, dressing, self feeding, reach over head, and hygiene/grooming  PARTICIPATION LIMITATIONS: meal prep, cleaning, laundry, medication management,  interpersonal relationship, driving, shopping, community activity, and occupation  PERSONAL FACTORS: Age, Behavior pattern, Education, Fitness, and Past/current experiences are also affecting patient's functional outcome.   REHAB POTENTIAL: Excellent  CLINICAL DECISION MAKING: Stable/uncomplicated  EVALUATION COMPLEXITY: Low   GOALS: Goals reviewed with patient? Yes  SHORT TERM GOALS: Target date: 12/12/2022    Will be compliant with appropriate progressive HEP Baseline: Goal status: INITIAL  2.  PROM and AAROM of surgical shoulder to be at least 150* flexion and abduction, PROM and AAROM ER/IR to be at least 45* without increase in pain  Baseline:  Goal status: INITIAL  3.  Pain in surgical shoulder to be no more than 4/10 at worst  Baseline:  Goal status: INITIAL  4.  Will be able to perform computer tasks on a Mod(I) basis with ergonomic and supportive adjustments PRN and no increase in pain  Baseline:  Goal status: INITIAL   LONG TERM GOALS: Target date: 01/09/2023    MMT surgical UE and periscap musculature  to be at least 4/5  Baseline:  Goal status: INITIAL  2.  Surgical UE AROM flexion and abduction  to be full, ER/IR AROM to be at least 60 degrees  Baseline:  Goal status: INITIAL  3.  Will be able to perform all dressing/bathing/self care tasks on an independent basis  Baseline:  Goal status: INITIAL  4.  Pain to be no more than 2/10 at worst Baseline:  Goal status: INITIAL  5.  Will tolerate light strengthening activities within protocol to assist in prep for return to sports specific tasks  Baseline:  Goal status: INITIAL  PLAN:  PT FREQUENCY:  3x/week for 4 weeks, then 2x/week for next 4 weeks   PT DURATION: 8 weeks  PLANNED INTERVENTIONS: Therapeutic exercises, Therapeutic activity, Neuromuscular re-education, Balance training, Gait training, Patient/Family education, Self Care, Joint mobilization, Aquatic Therapy, Dry Needling, Electrical stimulation, Cryotherapy, Moist heat, Taping, Ultrasound, Ionotophoresis 4mg /ml Dexamethasone, Manual therapy, and Re-evaluation  PLAN FOR NEXT SESSION: per protocol   Carolyne Littles PT DPT    11/28/2022, 11:08 AM

## 2022-12-03 ENCOUNTER — Encounter (HOSPITAL_BASED_OUTPATIENT_CLINIC_OR_DEPARTMENT_OTHER): Payer: Self-pay | Admitting: Physical Therapy

## 2022-12-08 ENCOUNTER — Ambulatory Visit (HOSPITAL_BASED_OUTPATIENT_CLINIC_OR_DEPARTMENT_OTHER): Payer: No Typology Code available for payment source | Admitting: Physical Therapy

## 2022-12-08 ENCOUNTER — Encounter (HOSPITAL_BASED_OUTPATIENT_CLINIC_OR_DEPARTMENT_OTHER): Payer: Self-pay | Admitting: Physical Therapy

## 2022-12-08 DIAGNOSIS — M6281 Muscle weakness (generalized): Secondary | ICD-10-CM

## 2022-12-08 DIAGNOSIS — R600 Localized edema: Secondary | ICD-10-CM

## 2022-12-08 DIAGNOSIS — M25511 Pain in right shoulder: Secondary | ICD-10-CM

## 2022-12-08 DIAGNOSIS — M25611 Stiffness of right shoulder, not elsewhere classified: Secondary | ICD-10-CM

## 2022-12-08 NOTE — Therapy (Signed)
OUTPATIENT PHYSICAL THERAPY SHOULDER TREATMENT    Patient Name: Andrew Cunningham MRN: [id number] DOB:1971/06/26, 52 y.o., male Today's Date: 11/28/2022  END OF SESSION:  PT End of Session - 11/28/22 1106     Visit Number 6    Number of Visits 21    Date for PT Re-Evaluation 01/09/23    Authorization Type [employer]    Authorization Time Period 11/14/22 to 01/23/23    PT Start Time 1015    PT Stop Time 1100    PT Time Calculation (min) 45 min    Activity Tolerance Patient tolerated treatment well    Behavior During Therapy WFL for tasks assessed/performed                Past Medical History:  Diagnosis Date   Anxiety    per patient    Cervical spine arthritis    COVID-19    ED (erectile dysfunction)    GERD (gastroesophageal reflux disease)    per patient    Hyperlipemia    per patient    Hypertension    per patient    PONV (postoperative nausea and vomiting)    Situational stress    Past Surgical History:  Procedure Laterality Date   APPENDECTOMY  2005   [place] ARTHROSCOPY WITH LABRAL REPAIR Right 11/11/2022   Procedure: RIGHT SHOULDER ARTHROSCOPY WITH LABRAL REPAIR AND LOOSE BODY REMOVAL;  Surgeon: Vanetta Mulders, MD;  Location: [place];  Service: Orthopedics;  Laterality: Right;   Patient Active Problem List   Diagnosis Date Noted   Labral tear of shoulder, right, initial encounter 11/11/2022   Cervical spine arthritis 08/22/2020   History of anxiety 08/22/2020   History of gastroesophageal reflux (GERD) 08/22/2020   Essential hypertension 08/22/2020   History of hyperlipidemia 08/22/2020    PCP: [name] MD   REFERRING PROVIDER: Vanetta Mulders, MD  REFERRING DIAG:  [phone] (ICD-10-CM) - Labral tear of shoulder, right, initial encounter    THERAPY DIAG:  Stiffness of right shoulder, not elsewhere classified  Acute pain of right shoulder  Muscle weakness (generalized)  Localized  edema  Rationale for Evaluation and Treatment: Rehabilitation  ONSET DATE: 10/15/2022  SUBJECTIVE:                                                                                                                                                                                      SUBJECTIVE STATEMENT: The patient bumped into a refrigerator door and became sore.    PERTINENT HISTORY: 52 y.o. male right-hand-dominant male presents with right shoulder pain and weakness now ongoing for 6 months.  At this time  he has tried activity restriction and activity modification.  He has done physical therapy for strengthening of the shoulder.  He is trialed anti-inflammatories.  I did discuss that his exam is consistent with a labral tear with an intra-articular loose body.  Given the fact that there is a loose body we discussed the role for arthroscopic intervention and debridement with labral repair as needed.  I discussed the rehab associated with this.  I did also discuss an additional option would be to try steroid injection particularly as he has an upcoming trip to [place] for which she will be doing significant golfing.  I advised that this is also a possibility.  He would like to consider his options and he will call us back to let us know  PAIN:  Are you having pain? Yes: NPRS scale: 1-2/10 at rest  Pain location: R shoulder  Pain description: dull   Aggravating factors: jolts/bumping into stuff  Relieving factors: iceman, medicine  PRECAUTIONS: Other: NWB R UE, superior and inferior labral repair; PROM to tolerance per MD    WEIGHT BEARING RESTRICTIONS: Yes NWB RUE   FALLS:  Has patient fallen in last 6 months? 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 Sammuel Hines January 30th or 31st   OBJECTIVE:   DIAGNOSTIC FINDINGS:  IMPRESSION: 1. Rotator cuff tendinosis without tear. 2. Tear of the posterosuperior labrum. A multilobulated paralabral cyst extends along the posterior aspect of the glenohumeral joint extending towards the quadrilateral space measuring approximately 3.3 x 0.9 x 1.1 cm. 3. Mild degenerative changes of the [facility] joint with trace AC joint effusion. Mild pericapsular edema may be reactive or posttraumatic. 4. Trace subacromial-subdeltoid bursitis.  PROCEDURE: 1. Right shoulder superior labral repair 2. Right shoulder inferior labral repair 3. Right shoulder limited debridement  POSTOPERATIVE PLAN: He will be nonweight bearing on the right arm pending physical therapy. He will be mobilization and passive range of motion. He will be placed on aspirin for blood clot prevention  PATIENT SURVEYS:  FOTO will do 2nd session   COGNITION: Overall cognitive status: Within functional limits for tasks assessed     SENSATION: Not tested  POSTURE: Rounded shoulder, forward head   UPPER EXTREMITY ROM:   Passive ROM Right eval Left eval  Shoulder flexion 70*   Shoulder extension    Shoulder abduction 25*   Shoulder adduction    Shoulder internal rotation    Shoulder external rotation -45* limited by pain    Elbow flexion    Elbow extension    Wrist flexion    Wrist extension    Wrist ulnar deviation    Wrist radial deviation    Wrist pronation    Wrist supination    (Blank rows = not tested)  UPPER EXTREMITY MMT:  MMT Right eval Left eval  Shoulder flexion    Shoulder extension    Shoulder abduction    Shoulder adduction    Shoulder internal rotation    Shoulder external rotation    Middle trapezius    Lower trapezius     Elbow flexion    Elbow  extension    Wrist flexion    Wrist extension    Wrist ulnar deviation    Wrist radial deviation    Wrist pronation    Wrist supination    Grip strength (lbs)    (Blank rows = not tested)  DNT due to new surgery, restrictions of protocol   SHOULDER SPECIAL TESTS:   JOINT MOBILITY TESTING:    PALPATION:     TODAY'S TREATMENT:                                                                                                                                         DATE:  2/12  Manual: Flexion, ABD, ER, IR PROM as tolerated (as per MD); trigger point release to upper trap grade 1 and 2 inferior and posterior glides  Wand flexion 1 lb 2x10   Prone: scap retraction 2x15  Extension 2x15  Abduction 2x15   Supine ABC 1x small    Finger ladder 3x    2/6 - Manual: Flexion, ABD, ER, IR PROM as tolerated (as per MD); trigger point release to upper trap grade 1 and 2 inferior and posterior glides - active ER 2x20  - Elbow flexion/ extension x20  - pulleys 2 min  - elbow flexion/ extension x20  - self flexion x20 in supine - scap retraction x20   2/2  Manual: Flexion, ABD, ER, IR PROM as tolerated (as per MD): Scapular retraction x20 Self flexion with right arm with cuing not to pull into abduction  Self ER with cuing not to push to end range  Elbow flexion/extension x20     PATIENT EDUCATION: Education details: intervention purpose  Person educated: Patient and Spouse Education method: Explanation, Demonstration, and Handouts Education comprehension: verbalized understanding and returned demonstration  HOME EXERCISE PROGRAM: Access Code: [email or web address] URL: [email or web address] Date: 11/14/2022 Prepared by: Deniece Ree  Exercises - Flexion-Extension Shoulder Pendulum with Table Support  - 1 x daily - 7 x weekly - 3 sets - 10 reps - Horizontal Shoulder Pendulum with Table Support  - 1 x daily - 7 x weekly - 3 sets - 10 reps -  Seated Elbow Flexion and Extension AROM  - 1 x daily - 7 x weekly - 3 sets - 10 reps - Wrist Flexion Extension AROM with Fingers Curled and Palm Down  - 1 x daily - 7 x weekly - 3 sets - 10 reps - Seated Gripping Towel  - 1 x daily - 7 x weekly - 3 sets - 10 reps - Thumb AROM Opposition To All Fingers  - 1 x daily - 7 x weekly - 3 sets - 10 reps  ASSESSMENT:  CLINICAL IMPRESSION: The patient continues to make great progress. We added in prone strengthening today We added in supine ABC without significant pain. His range of motion is progressing very well. We will continue to progress as tolerated. He  had a mild posterior pinching with abduction so that movement was held  OBJECTIVE IMPAIRMENTS: decreased knowledge of condition, decreased ROM, decreased strength, hypomobility, increased edema, increased fascial restrictions, increased muscle spasms, impaired flexibility, impaired UE functional use, and pain.   ACTIVITY LIMITATIONS: carrying, lifting, bathing, toileting, dressing, self feeding, reach over head, and hygiene/grooming  PARTICIPATION LIMITATIONS: meal prep, cleaning, laundry, medication management, interpersonal relationship, driving, shopping, community activity, and occupation  PERSONAL FACTORS: Age, Behavior pattern, Education, Fitness, and Past/current experiences are also affecting patient's functional outcome.   REHAB POTENTIAL: Excellent  CLINICAL DECISION MAKING: Stable/uncomplicated  EVALUATION COMPLEXITY: Low   GOALS: Goals reviewed with patient? Yes  SHORT TERM GOALS: Target date: 12/12/2022    Will be compliant with appropriate progressive HEP Baseline: Goal status: INITIAL  2.  PROM and AAROM of surgical shoulder to be at least 150* flexion and abduction, PROM and AAROM ER/IR to be at least 45* without increase in pain  Baseline:  Goal status: INITIAL  3.  Pain in surgical shoulder to be no more than 4/10 at worst  Baseline:  Goal status: INITIAL  4.   Will be able to perform computer tasks on a Mod(I) basis with ergonomic and supportive adjustments PRN and no increase in pain  Baseline:  Goal status: INITIAL   LONG TERM GOALS: Target date: 01/09/2023    MMT surgical UE and periscap musculature  to be at least 4/5  Baseline:  Goal status: INITIAL  2.  Surgical UE AROM flexion and abduction to be full, ER/IR AROM to be at least 60 degrees  Baseline:  Goal status: INITIAL  3.  Will be able to perform all dressing/bathing/self care tasks on an independent basis  Baseline:  Goal status: INITIAL  4.  Pain to be no more than 2/10 at worst Baseline:  Goal status: INITIAL  5.  Will tolerate light strengthening activities within protocol to assist in prep for return to sports specific tasks  Baseline:  Goal status: INITIAL  PLAN:  PT FREQUENCY:  3x/week for 4 weeks, then 2x/week for next 4 weeks   PT DURATION: 8 weeks  PLANNED INTERVENTIONS: Therapeutic exercises, Therapeutic activity, Neuromuscular re-education, Balance training, Gait training, Patient/Family education, Self Care, Joint mobilization, Aquatic Therapy, Dry Needling, Electrical stimulation, Cryotherapy, Moist heat, Taping, Ultrasound, Ionotophoresis 4mg /ml Dexamethasone, Manual therapy, and Re-evaluation  PLAN FOR NEXT SESSION: per protocol   Carolyne Littles PT DPT    11/28/2022, 11:08 AM

## 2022-12-10 ENCOUNTER — Ambulatory Visit (HOSPITAL_BASED_OUTPATIENT_CLINIC_OR_DEPARTMENT_OTHER): Payer: No Typology Code available for payment source

## 2022-12-10 ENCOUNTER — Encounter (HOSPITAL_BASED_OUTPATIENT_CLINIC_OR_DEPARTMENT_OTHER): Payer: Self-pay

## 2022-12-10 DIAGNOSIS — M25611 Stiffness of right shoulder, not elsewhere classified: Secondary | ICD-10-CM

## 2022-12-10 DIAGNOSIS — M25511 Pain in right shoulder: Secondary | ICD-10-CM

## 2022-12-10 DIAGNOSIS — M6281 Muscle weakness (generalized): Secondary | ICD-10-CM

## 2022-12-10 DIAGNOSIS — R600 Localized edema: Secondary | ICD-10-CM

## 2022-12-10 NOTE — Therapy (Signed)
OUTPATIENT PHYSICAL THERAPY SHOULDER TREATMENT    Patient Name: MATTHEU BRODERSEN MRN: [id number] DOB:August 30, 1971, 52 y.o., male Today's Date: 12/10/2022  END OF SESSION:  PT End of Session - 12/10/22 0826     Visit Number 9    Number of Visits 21    Date for PT Re-Evaluation 01/09/23    Authorization Type [employer]    Authorization Time Period 11/14/22 to 01/23/23    PT Start Time 0802    PT Stop Time 0845    PT Time Calculation (min) 43 min    Activity Tolerance Patient tolerated treatment well    Behavior During Therapy WFL for tasks assessed/performed                Past Medical History:  Diagnosis Date   Anxiety    per patient    Cervical spine arthritis    COVID-19    ED (erectile dysfunction)    GERD (gastroesophageal reflux disease)    per patient    Hyperlipemia    per patient    Hypertension    per patient    PONV (postoperative nausea and vomiting)    Situational stress    Past Surgical History:  Procedure Laterality Date   APPENDECTOMY  2005   [place] ARTHROSCOPY WITH LABRAL REPAIR Right 11/11/2022   Procedure: RIGHT SHOULDER ARTHROSCOPY WITH LABRAL REPAIR AND LOOSE BODY REMOVAL;  Surgeon: Vanetta Mulders, MD;  Location: [facility];  Service: Orthopedics;  Laterality: Right;   Patient Active Problem List   Diagnosis Date Noted   Labral tear of shoulder, right, initial encounter 11/11/2022   Cervical spine arthritis 08/22/2020   History of anxiety 08/22/2020   History of gastroesophageal reflux (GERD) 08/22/2020   Essential hypertension 08/22/2020   History of hyperlipidemia 08/22/2020    PCP: [name] MD   REFERRING PROVIDER: Vanetta Mulders, MD  REFERRING DIAG:  [phone] (ICD-10-CM) - Labral tear of shoulder, right, initial encounter    THERAPY DIAG:  Stiffness of right shoulder, not elsewhere classified  Acute pain of right shoulder  Localized edema  Muscle weakness  (generalized)  Rationale for Evaluation and Treatment: Rehabilitation  ONSET DATE: 10/15/2022  SUBJECTIVE:                                                                                                                                                                                      SUBJECTIVE STATEMENT: Pt reports 1/10 pain level at entry. Has been trying to sleep without sling. "I may have rolled over on it so it's a little sore when I move it, but not bad."   PERTINENT  HISTORY: 52 y.o. male right-hand-dominant male presents with right shoulder pain and weakness now ongoing for 6 months.  At this time he has tried activity restriction and activity modification.  He has done physical therapy for strengthening of the shoulder.  He is trialed anti-inflammatories.  I did discuss that his exam is consistent with a labral tear with an intra-articular loose body.  Given the fact that there is a loose body we discussed the role for arthroscopic intervention and debridement with labral repair as needed.  I discussed the rehab associated with this.  I did also discuss an additional option would be to try steroid injection particularly as he has an upcoming trip to [place] for which she will be doing significant golfing.  I advised that this is also a possibility.  He would like to consider his options and he will call us back to let us know  PAIN:  Are you having pain? Yes: NPRS scale: 1-2/10 at rest  Pain location: R shoulder  Pain description: dull   Aggravating factors: jolts/bumping into stuff  Relieving factors: iceman, medicine  PRECAUTIONS: Other: NWB R UE, superior and inferior labral repair; PROM to tolerance per MD    WEIGHT BEARING RESTRICTIONS: Yes NWB RUE   FALLS:  Has patient fallen in last 6 months? 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 Sammuel Hines January 30th or 31st   OBJECTIVE:   DIAGNOSTIC FINDINGS:  IMPRESSION: 1. Rotator cuff tendinosis without tear. 2. Tear of the posterosuperior labrum. A multilobulated paralabral cyst extends along the posterior aspect of the glenohumeral joint extending towards the quadrilateral space measuring approximately 3.3 x 0.9 x 1.1 cm. 3. Mild degenerative changes of the [facility] joint with trace AC joint effusion. Mild pericapsular edema may be reactive or posttraumatic. 4. Trace subacromial-subdeltoid bursitis.  PROCEDURE: 1. Right shoulder superior labral repair 2. Right shoulder inferior labral repair 3. Right shoulder limited debridement  POSTOPERATIVE PLAN: He will be nonweight bearing on the right arm pending physical therapy. He will be mobilization and passive range of motion. He will be placed on aspirin for blood clot prevention  PATIENT SURVEYS:  FOTO will do 2nd session   COGNITION: Overall cognitive status: Within functional limits for tasks assessed     SENSATION: Not tested  POSTURE: Rounded shoulder, forward head   UPPER EXTREMITY ROM:   Passive ROM Right eval Left eval  Shoulder flexion 70*   Shoulder extension    Shoulder abduction 25*   Shoulder adduction    Shoulder internal rotation    Shoulder external rotation -45* limited by pain    Elbow flexion    Elbow extension    Wrist flexion    Wrist extension    Wrist ulnar deviation    Wrist radial deviation    Wrist pronation    Wrist supination    (Blank rows = not tested)  UPPER EXTREMITY MMT:  MMT Right eval Left eval  Shoulder flexion    Shoulder extension    Shoulder abduction    Shoulder  adduction    Shoulder internal rotation  Shoulder external rotation    Middle trapezius    Lower trapezius    Elbow flexion    Elbow extension    Wrist flexion    Wrist extension    Wrist ulnar deviation    Wrist radial deviation    Wrist pronation    Wrist supination    Grip strength (lbs)    (Blank rows = not tested)  DNT due to new surgery, restrictions of protocol     TODAY'S TREATMENT:                                                                                                                                         DATE:   2/14  Manual: Flexion, ABD, ER, IR PROM as tolerated   Active supine flexion x10 Wand flexion 1 lb 2x10   Prone: scap retraction 2x10- 5" hold Extension 2x15   Bicep curl 2lb 2x10  Isometrics-felx,abd,IR,ER 5" 1x10 ea  Finger ladder 2x   2/12  Manual: Flexion, ABD, ER, IR PROM as tolerated (as per MD); trigger point release to upper trap grade 1 and 2 inferior and posterior glides  Wand flexion 1 lb 2x10  Prone: scap retraction 2x15  Extension 2x15  Abduction 2x15   Supine ABC 1x small    Finger ladder 3x    2/6 - Manual: Flexion, ABD, ER, IR PROM as tolerated (as per MD); trigger point release to upper trap grade 1 and 2 inferior and posterior glides - active ER 2x20  - Elbow flexion/ extension x20  - pulleys 2 min  - elbow flexion/ extension x20  - self flexion x20 in supine - scap retraction x20   2/2  Manual: Flexion, ABD, ER, IR PROM as tolerated (as per MD): Scapular retraction x20 Self flexion with right arm with cuing not to pull into abduction  Self ER with cuing not to push to end range  Elbow flexion/extension x20     PATIENT EDUCATION: Education details: intervention purpose  Person educated: Patient and Spouse Education method: Explanation, Demonstration, and Handouts Education comprehension: verbalized understanding and returned demonstration  HOME EXERCISE PROGRAM: Access Code: [email or web address] URL:  [email or web address] Date: 11/14/2022 Prepared by: Deniece Ree  Exercises - Flexion-Extension Shoulder Pendulum with Table Support  - 1 x daily - 7 x weekly - 3 sets - 10 reps - Horizontal Shoulder Pendulum with Table Support  - 1 x daily - 7 x weekly - 3 sets - 10 reps - Seated Elbow Flexion and Extension AROM  - 1 x daily - 7 x weekly - 3 sets - 10 reps - Wrist Flexion Extension AROM with Fingers Curled and Palm Down  - 1 x daily - 7 x weekly - 3 sets - 10 reps - Seated Gripping Towel  - 1 x daily - 7 x weekly - 3 sets - 10 reps - Thumb AROM Opposition To All Fingers  -  1 x daily - 7 x weekly - 3 sets - 10 reps  ASSESSMENT:  CLINICAL IMPRESSION: Mild increase in soreness with activities, but pt reported this was less than 4/10 pain level. PROM is slowly improving, though still limited in each plane. He was able to trial supine active shoulder flexion with fatigue/shakiness observed, but overall good tolerance. Mild difficulty with finger ladder. Able to add resistance to bicep curls today. Initiated shoulder isometrics with mild discomfort reported. Instructed pt not to add these to HEP until response is assess next visit.    OBJECTIVE IMPAIRMENTS: decreased knowledge of condition, decreased ROM, decreased strength, hypomobility, increased edema, increased fascial restrictions, increased muscle spasms, impaired flexibility, impaired UE functional use, and pain.   ACTIVITY LIMITATIONS: carrying, lifting, bathing, toileting, dressing, self feeding, reach over head, and hygiene/grooming  PARTICIPATION LIMITATIONS: meal prep, cleaning, laundry, medication management, interpersonal relationship, driving, shopping, community activity, and occupation  PERSONAL FACTORS: Age, Behavior pattern, Education, Fitness, and Past/current experiences are also affecting patient's functional outcome.   REHAB POTENTIAL: Excellent  CLINICAL DECISION MAKING: Stable/uncomplicated  EVALUATION  COMPLEXITY: Low   GOALS: Goals reviewed with patient? Yes  SHORT TERM GOALS: Target date: 12/12/2022    Will be compliant with appropriate progressive HEP Baseline: Goal status: INITIAL  2.  PROM and AAROM of surgical shoulder to be at least 150* flexion and abduction, PROM and AAROM ER/IR to be at least 45* without increase in pain  Baseline:  Goal status: INITIAL  3.  Pain in surgical shoulder to be no more than 4/10 at worst  Baseline:  Goal status: INITIAL  4.  Will be able to perform computer tasks on a Mod(I) basis with ergonomic and supportive adjustments PRN and no increase in pain  Baseline:  Goal status: INITIAL   LONG TERM GOALS: Target date: 01/09/2023    MMT surgical UE and periscap musculature  to be at least 4/5  Baseline:  Goal status: INITIAL  2.  Surgical UE AROM flexion and abduction to be full, ER/IR AROM to be at least 60 degrees  Baseline:  Goal status: INITIAL  3.  Will be able to perform all dressing/bathing/self care tasks on an independent basis  Baseline:  Goal status: INITIAL  4.  Pain to be no more than 2/10 at worst Baseline:  Goal status: INITIAL  5.  Will tolerate light strengthening activities within protocol to assist in prep for return to sports specific tasks  Baseline:  Goal status: INITIAL  PLAN:  PT FREQUENCY:  3x/week for 4 weeks, then 2x/week for next 4 weeks   PT DURATION: 8 weeks  PLANNED INTERVENTIONS: Therapeutic exercises, Therapeutic activity, Neuromuscular re-education, Balance training, Gait training, Patient/Family education, Self Care, Joint mobilization, Aquatic Therapy, Dry Needling, Electrical stimulation, Cryotherapy, Moist heat, Taping, Ultrasound, Ionotophoresis 4mg /ml Dexamethasone, Manual therapy, and Re-evaluation  PLAN FOR NEXT SESSION: per protocol   Sherlynn Carbon, PTA    12/10/2022, 9:47 AM

## 2022-12-12 ENCOUNTER — Ambulatory Visit (HOSPITAL_BASED_OUTPATIENT_CLINIC_OR_DEPARTMENT_OTHER): Payer: No Typology Code available for payment source | Admitting: Physical Therapy

## 2022-12-12 DIAGNOSIS — R600 Localized edema: Secondary | ICD-10-CM

## 2022-12-12 DIAGNOSIS — M25611 Stiffness of right shoulder, not elsewhere classified: Secondary | ICD-10-CM

## 2022-12-12 DIAGNOSIS — M25511 Pain in right shoulder: Secondary | ICD-10-CM

## 2022-12-12 DIAGNOSIS — M6281 Muscle weakness (generalized): Secondary | ICD-10-CM

## 2022-12-12 NOTE — Therapy (Unsigned)
OUTPATIENT PHYSICAL THERAPY SHOULDER TREATMENT    Patient Name: Andrew Cunningham MRN: [id number] DOB:Nov 06, 1970, 52 y.o., male Today's Date: 12/14/2022  END OF SESSION:  PT End of Session - 12/14/22 1640     Visit Number 10    Number of Visits 21    Date for PT Re-Evaluation 01/09/23    Authorization Type Aetna    Authorization Time Period 11/14/22 to 01/23/23    PT Start Time 1300    PT Stop Time 1344    PT Time Calculation (min) 44 min    Activity Tolerance Patient tolerated treatment well    Behavior During Therapy WFL for tasks assessed/performed                 Past Medical History:  Diagnosis Date   Anxiety    per patient    Cervical spine arthritis    COVID-19    ED (erectile dysfunction)    GERD (gastroesophageal reflux disease)    per patient    Hyperlipemia    per patient    Hypertension    per patient    PONV (postoperative nausea and vomiting)    Situational stress    Past Surgical History:  Procedure Laterality Date   APPENDECTOMY  2005   [place] ARTHROSCOPY WITH LABRAL REPAIR Right 11/11/2022   Procedure: RIGHT SHOULDER ARTHROSCOPY WITH LABRAL REPAIR AND LOOSE BODY REMOVAL;  Surgeon: Vanetta Mulders, MD;  Location: [facility];  Service: Orthopedics;  Laterality: Right;   Patient Active Problem List   Diagnosis Date Noted   Labral tear of shoulder, right, initial encounter 11/11/2022   Cervical spine arthritis 08/22/2020   History of anxiety 08/22/2020   History of gastroesophageal reflux (GERD) 08/22/2020   Essential hypertension 08/22/2020   History of hyperlipidemia 08/22/2020    PCP: [name] MD   REFERRING PROVIDER: Vanetta Mulders, MD  REFERRING DIAG:  [phone] (ICD-10-CM) - Labral tear of shoulder, right, initial encounter    THERAPY DIAG:  Stiffness of right shoulder, not elsewhere classified  Acute pain of right shoulder  Localized edema  Muscle weakness  (generalized)  Rationale for Evaluation and Treatment: Rehabilitation  ONSET DATE: 10/15/2022  SUBJECTIVE:                                                                                                                                                                                      SUBJECTIVE STATEMENT: Patient has no complaints today. He has had some soreness when he sleeps on it but that is about it.  PERTINENT HISTORY: 52 y.o. male right-hand-dominant male presents with right shoulder pain and weakness  now ongoing for 6 months.  At this time he has tried activity restriction and activity modification.  He has done physical therapy for strengthening of the shoulder.  He is trialed anti-inflammatories.  I did discuss that his exam is consistent with a labral tear with an intra-articular loose body.  Given the fact that there is a loose body we discussed the role for arthroscopic intervention and debridement with labral repair as needed.  I discussed the rehab associated with this.  I did also discuss an additional option would be to try steroid injection particularly as he has an upcoming trip to [place] for which she will be doing significant golfing.  I advised that this is also a possibility.  He would like to consider his options and he will call us back to let us know  PAIN:  Are you having pain? Yes: NPRS scale: 1-2/10 at rest  Pain location: R shoulder  Pain description: dull   Aggravating factors: jolts/bumping into stuff  Relieving factors: iceman, medicine  PRECAUTIONS: Other: NWB R UE, superior and inferior labral repair; PROM to tolerance per MD    WEIGHT BEARING RESTRICTIONS: Yes NWB RUE   FALLS:  Has patient fallen in last 6 months? 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 Sammuel Hines January 30th or 31st   OBJECTIVE:   DIAGNOSTIC FINDINGS:  IMPRESSION: 1. Rotator cuff tendinosis without tear. 2. Tear of the posterosuperior labrum. A multilobulated paralabral cyst extends along the posterior aspect of the glenohumeral joint extending towards the quadrilateral space measuring approximately 3.3 x 0.9 x 1.1 cm. 3. Mild degenerative changes of the [facility] joint with trace AC joint effusion. Mild pericapsular edema may be reactive or posttraumatic. 4. Trace subacromial-subdeltoid bursitis.  PROCEDURE: 1. Right shoulder superior labral repair 2. Right shoulder inferior labral repair 3. Right shoulder limited debridement  POSTOPERATIVE PLAN: He will be nonweight bearing on the right arm pending physical therapy. He will be mobilization and passive range of motion. He will be placed on aspirin for blood clot prevention  PATIENT SURVEYS:  FOTO will do 2nd session   COGNITION: Overall cognitive status: Within functional limits for tasks assessed     SENSATION: Not tested  POSTURE: Rounded shoulder, forward head   UPPER EXTREMITY ROM:   Passive ROM Right eval Left eval  Shoulder flexion 70*   Shoulder extension    Shoulder abduction 25*   Shoulder adduction    Shoulder internal rotation    Shoulder external rotation -45* limited by pain    Elbow flexion    Elbow extension    Wrist flexion    Wrist extension    Wrist ulnar deviation    Wrist radial deviation    Wrist pronation    Wrist supination    (Blank rows = not tested)  UPPER EXTREMITY MMT:  MMT Right eval Left eval  Shoulder flexion    Shoulder extension    Shoulder abduction    Shoulder adduction    Shoulder internal rotation    Shoulder external  rotation    Middle trapezius    Lower trapezius  Elbow flexion    Elbow extension    Wrist flexion    Wrist extension    Wrist ulnar deviation    Wrist radial deviation    Wrist pronation    Wrist supination    Grip strength (lbs)    (Blank rows = not tested)  DNT due to new surgery, restrictions of protocol     TODAY'S TREATMENT:                                                                                                                                         DATE:  2/16 Wand flexion x20 1 lbs  ABC 2x   Bicep curl 2lb 2x10  Prone row 2x15  Prone extension 2x15   Finger ladder 2x   SL ER 2x10   Pulley flexion 2 min  Scpation 2 min   2/14  Manual: Flexion, ABD, ER, IR PROM as tolerated   Active supine flexion x10 Wand flexion 1 lb 2x10   Prone: scap retraction 2x10- 5" hold Extension 2x15   Bicep curl 2lb 2x10  Isometrics-felx,abd,IR,ER 5" 1x10 ea  Finger ladder 2x   2/12  Manual: Flexion, ABD, ER, IR PROM as tolerated (as per MD); trigger point release to upper trap grade 1 and 2 inferior and posterior glides  Wand flexion 1 lb 2x10  Prone: scap retraction 2x15  Extension 2x15  Abduction 2x15   Supine ABC 1x small    Finger ladder 3x    2/6 - Manual: Flexion, ABD, ER, IR PROM as tolerated (as per MD); trigger point release to upper trap grade 1 and 2 inferior and posterior glides - active ER 2x20  - Elbow flexion/ extension x20  - pulleys 2 min  - elbow flexion/ extension x20  - self flexion x20 in supine - scap retraction x20   2/2  Manual: Flexion, ABD, ER, IR PROM as tolerated (as per MD): Scapular retraction x20 Self flexion with right arm with cuing not to pull into abduction  Self ER with cuing not to push to end range  Elbow flexion/extension x20     PATIENT EDUCATION: Education details: intervention purpose  Person educated: Patient and Spouse Education method: Explanation, Demonstration, and Handouts Education  comprehension: verbalized understanding and returned demonstration  HOME EXERCISE PROGRAM: Access Code: [email or web address] URL: [email or web address] Date: 11/14/2022 Prepared by: Deniece Ree  Exercises - Flexion-Extension Shoulder Pendulum with Table Support  - 1 x daily - 7 x weekly - 3 sets - 10 reps - Horizontal Shoulder Pendulum with Table Support  - 1 x daily - 7 x weekly - 3 sets - 10 reps - Seated Elbow Flexion and Extension AROM  - 1 x daily - 7 x weekly - 3 sets - 10 reps - Wrist Flexion Extension AROM with Fingers Curled and Palm Down  - 1 x daily - 7 x weekly - 3 sets - 10  reps - Seated Gripping Towel  - 1 x daily - 7 x weekly - 3 sets - 10 reps - Thumb AROM Opposition To All Fingers  - 1 x daily - 7 x weekly - 3 sets - 10 reps  ASSESSMENT:  CLINICAL IMPRESSION: Patient demonstrated improved passive range of motion with manual therapy.  Overall he is progressing well.  Therapy added in side-lying ER, as well as supine ABC to his home program.  He had no significant increase in pain.  Will continue to progress as tolerated. OBJECTIVE IMPAIRMENTS: decreased knowledge of condition, decreased ROM, decreased strength, hypomobility, increased edema, increased fascial restrictions, increased muscle spasms, impaired flexibility, impaired UE functional use, and pain.   ACTIVITY LIMITATIONS: carrying, lifting, bathing, toileting, dressing, self feeding, reach over head, and hygiene/grooming  PARTICIPATION LIMITATIONS: meal prep, cleaning, laundry, medication management, interpersonal relationship, driving, shopping, community activity, and occupation  PERSONAL FACTORS: Age, Behavior pattern, Education, Fitness, and Past/current experiences are also affecting patient's functional outcome.   REHAB POTENTIAL: Excellent  CLINICAL DECISION MAKING: Stable/uncomplicated  EVALUATION COMPLEXITY: Low   GOALS: Goals reviewed with patient? Yes  SHORT TERM GOALS: Target date:  12/12/2022    Will be compliant with appropriate progressive HEP Baseline: Goal status: INITIAL  2.  PROM and AAROM of surgical shoulder to be at least 150* flexion and abduction, PROM and AAROM ER/IR to be at least 45* without increase in pain  Baseline:  Goal status: INITIAL  3.  Pain in surgical shoulder to be no more than 4/10 at worst  Baseline:  Goal status: INITIAL  4.  Will be able to perform computer tasks on a Mod(I) basis with ergonomic and supportive adjustments PRN and no increase in pain  Baseline:  Goal status: INITIAL   LONG TERM GOALS: Target date: 01/09/2023    MMT surgical UE and periscap musculature  to be at least 4/5  Baseline:  Goal status: INITIAL  2.  Surgical UE AROM flexion and abduction to be full, ER/IR AROM to be at least 60 degrees  Baseline:  Goal status: INITIAL  3.  Will be able to perform all dressing/bathing/self care tasks on an independent basis  Baseline:  Goal status: INITIAL  4.  Pain to be no more than 2/10 at worst Baseline:  Goal status: INITIAL  5.  Will tolerate light strengthening activities within protocol to assist in prep for return to sports specific tasks  Baseline:  Goal status: INITIAL  PLAN:  PT FREQUENCY:  3x/week for 4 weeks, then 2x/week for next 4 weeks   PT DURATION: 8 weeks  PLANNED INTERVENTIONS: Therapeutic exercises, Therapeutic activity, Neuromuscular re-education, Balance training, Gait training, Patient/Family education, Self Care, Joint mobilization, Aquatic Therapy, Dry Needling, Electrical stimulation, Cryotherapy, Moist heat, Taping, Ultrasound, Ionotophoresis 4mg /ml Dexamethasone, Manual therapy, and Re-evaluation  PLAN FOR NEXT SESSION: per protocol      12/14/2022, 4:40 PM

## 2022-12-14 ENCOUNTER — Encounter (HOSPITAL_BASED_OUTPATIENT_CLINIC_OR_DEPARTMENT_OTHER): Payer: Self-pay | Admitting: Physical Therapy

## 2022-12-15 ENCOUNTER — Ambulatory Visit (HOSPITAL_BASED_OUTPATIENT_CLINIC_OR_DEPARTMENT_OTHER): Payer: No Typology Code available for payment source

## 2022-12-15 ENCOUNTER — Encounter (HOSPITAL_BASED_OUTPATIENT_CLINIC_OR_DEPARTMENT_OTHER): Payer: Self-pay

## 2022-12-15 DIAGNOSIS — M6281 Muscle weakness (generalized): Secondary | ICD-10-CM

## 2022-12-15 DIAGNOSIS — M25611 Stiffness of right shoulder, not elsewhere classified: Secondary | ICD-10-CM | POA: Diagnosis not present

## 2022-12-15 DIAGNOSIS — M25511 Pain in right shoulder: Secondary | ICD-10-CM

## 2022-12-15 DIAGNOSIS — R600 Localized edema: Secondary | ICD-10-CM

## 2022-12-15 NOTE — Therapy (Signed)
OUTPATIENT PHYSICAL THERAPY SHOULDER TREATMENT    Patient Name: Andrew Cunningham MRN: [id number] DOB:[birth year], 52 y.o., male Today's Date: 12/15/2022  END OF SESSION:  PT End of Session - 12/15/22 1250     Visit Number 11    Number of Visits 21    Date for PT Re-Evaluation 01/09/23    Authorization Type [employer]    Authorization Time Period 11/14/22 to 01/23/23    PT Start Time 1150    PT Stop Time 1230    PT Time Calculation (min) 40 min    Activity Tolerance Patient tolerated treatment well    Behavior During Therapy WFL for tasks assessed/performed                  Past Medical History:  Diagnosis Date   Anxiety    per patient    Cervical spine arthritis    COVID-19    ED (erectile dysfunction)    GERD (gastroesophageal reflux disease)    per patient    Hyperlipemia    per patient    Hypertension    per patient    PONV (postoperative nausea and vomiting)    Situational stress    Past Surgical History:  Procedure Laterality Date   APPENDECTOMY  2005   [place] ARTHROSCOPY WITH LABRAL REPAIR Right 11/11/2022   Procedure: RIGHT SHOULDER ARTHROSCOPY WITH LABRAL REPAIR AND LOOSE BODY REMOVAL;  Surgeon: Vanetta Mulders, MD;  Location: [place];  Service: Orthopedics;  Laterality: Right;   Patient Active Problem List   Diagnosis Date Noted   Labral tear of shoulder, right, initial encounter 11/11/2022   Cervical spine arthritis 08/22/2020   History of anxiety 08/22/2020   History of gastroesophageal reflux (GERD) 08/22/2020   Essential hypertension 08/22/2020   History of hyperlipidemia 08/22/2020    PCP: [name] MD   REFERRING PROVIDER: Vanetta Mulders, MD  REFERRING DIAG:  [phone] (ICD-10-CM) - Labral tear of shoulder, right, initial encounter    THERAPY DIAG:  Stiffness of right shoulder, not elsewhere classified  Acute pain of right shoulder  Localized edema  Muscle weakness  (generalized)  Rationale for Evaluation and Treatment: Rehabilitation  ONSET DATE: 10/15/2022  SUBJECTIVE:                                                                                                                                                                                      SUBJECTIVE STATEMENT: Patient reports continued improvement in R shoulder each day. Has one more PT visit before his trip to [place].  PERTINENT HISTORY: 52 y.o. male right-hand-dominant male presents with right shoulder pain and weakness  now ongoing for 6 months.  At this time he has tried activity restriction and activity modification.  He has done physical therapy for strengthening of the shoulder.  He is trialed anti-inflammatories.  I did discuss that his exam is consistent with a labral tear with an intra-articular loose body.  Given the fact that there is a loose body we discussed the role for arthroscopic intervention and debridement with labral repair as needed.  I discussed the rehab associated with this.  I did also discuss an additional option would be to try steroid injection particularly as he has an upcoming trip to [place] for which she will be doing significant golfing.  I advised that this is also a possibility.  He would like to consider his options and he will call us back to let us know  PAIN:  Are you having pain? Yes: NPRS scale: 1-2/10 at rest  Pain location: R shoulder  Pain description: dull   Aggravating factors: jolts/bumping into stuff  Relieving factors: iceman, medicine  PRECAUTIONS: Other: NWB R UE, superior and inferior labral repair; PROM to tolerance per MD    WEIGHT BEARING RESTRICTIONS: Yes NWB RUE   FALLS:  Has patient fallen in last 6 months? 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 Sammuel Hines January 30th or 31st   OBJECTIVE:   DIAGNOSTIC FINDINGS:  IMPRESSION: 1. Rotator cuff tendinosis without tear. 2. Tear of the posterosuperior labrum. A multilobulated paralabral cyst extends along the posterior aspect of the glenohumeral joint extending towards the quadrilateral space measuring approximately 3.3 x 0.9 x 1.1 cm. 3. Mild degenerative changes of the [facility] joint with trace AC joint effusion. Mild pericapsular edema may be reactive or posttraumatic. 4. Trace subacromial-subdeltoid bursitis.  PROCEDURE: 1. Right shoulder superior labral repair 2. Right shoulder inferior labral repair 3. Right shoulder limited debridement  POSTOPERATIVE PLAN: He will be nonweight bearing on the right arm pending physical therapy. He will be mobilization and passive range of motion. He will be placed on aspirin for blood clot prevention  PATIENT SURVEYS:  FOTO will do 2nd session   COGNITION: Overall cognitive status: Within functional limits for tasks assessed     SENSATION: Not tested  POSTURE: Rounded shoulder, forward head   UPPER EXTREMITY ROM:   Passive ROM Right eval Left eval  Shoulder flexion 70*   Shoulder extension    Shoulder abduction 25*   Shoulder adduction    Shoulder internal rotation    Shoulder external rotation -45* limited by pain    Elbow flexion    Elbow extension    Wrist flexion    Wrist extension    Wrist ulnar deviation    Wrist radial deviation    Wrist pronation    Wrist supination    (Blank rows = not tested)  UPPER EXTREMITY MMT:  MMT Right eval Left eval  Shoulder flexion    Shoulder extension    Shoulder abduction    Shoulder adduction    Shoulder internal rotation     Shoulder external rotation    Middle trapezius    Lower trapezius  Elbow flexion    Elbow extension    Wrist flexion    Wrist extension    Wrist ulnar deviation    Wrist radial deviation    Wrist pronation    Wrist supination    Grip strength (lbs)    (Blank rows = not tested)  DNT due to new surgery, restrictions of protocol     TODAY'S TREATMENT:                                                                                                                                         DATE:   2/19 PROM R shoulder Wand flexion x20 1 lbs Wand abduction x10 1lb bar  ABC 2x  1lbs Active supine flexion x5 (fatigued quickly)  Bicep curl 3lb 2x10  STM to lats 3' (Very tight/tender)  Finger ladder 2x    Pulley flexion 2 min  Scpation 2 min   2/16 Wand flexion x20 1 lbs  ABC 2x   Bicep curl 2lb 2x10  Prone row 2x15  Prone extension 2x15   Finger ladder 2x   SL ER 2x10   Pulley flexion 2 min  Scpation 2 min   2/14  Manual: Flexion, ABD, ER, IR PROM as tolerated   Active supine flexion x10 Wand flexion 1 lb 2x10   Prone: scap retraction 2x10- 5" hold Extension 2x15   Bicep curl 2lb 2x10  Isometrics-felx,abd,IR,ER 5" 1x10 ea  Finger ladder 2x    PATIENT EDUCATION: Education details: intervention purpose  Person educated: Patient and Spouse Education method: Explanation, Media planner, and Handouts Education comprehension: verbalized understanding and returned demonstration  HOME EXERCISE PROGRAM: Access Code: [email or web address] URL: [email or web address] Date: 11/14/2022 Prepared by: Deniece Ree  Exercises - Flexion-Extension Shoulder Pendulum with Table Support  - 1 x daily - 7 x weekly - 3 sets - 10 reps - Horizontal Shoulder Pendulum with Table Support  - 1 x daily - 7 x weekly - 3 sets - 10 reps - Seated Elbow Flexion and Extension AROM  - 1 x daily - 7 x weekly - 3 sets - 10 reps - Wrist Flexion Extension AROM with Fingers Curled  and Palm Down  - 1 x daily - 7 x weekly - 3 sets - 10 reps - Seated Gripping Towel  - 1 x daily - 7 x weekly - 3 sets - 10 reps - Thumb AROM Opposition To All Fingers  - 1 x daily - 7 x weekly - 3 sets - 10 reps  ASSESSMENT:  CLINICAL IMPRESSION: Patient continues to demonstrate gross increase in ROM passively and with AAROM activities in clinic. Noticed significant R lat tightness today so worked on [employer] to improve this with fair tolerance due to hypersensitivity. Improving ability with AAROM tasks, though does fatigue in shoulder and arm musculature. By end of session pt reported improved mobility and decreased pain. He will continue to use ice  after PT sessions. He will be traveling for over 2 weeks and will not be able to attend PT during this time.  OBJECTIVE IMPAIRMENTS: decreased knowledge of condition, decreased ROM, decreased strength, hypomobility, increased edema, increased fascial restrictions, increased muscle spasms, impaired flexibility, impaired UE functional use, and pain.   ACTIVITY LIMITATIONS: carrying, lifting, bathing, toileting, dressing, self feeding, reach over head, and hygiene/grooming  PARTICIPATION LIMITATIONS: meal prep, cleaning, laundry, medication management, interpersonal relationship, driving, shopping, community activity, and occupation  PERSONAL FACTORS: Age, Behavior pattern, Education, Fitness, and Past/current experiences are also affecting patient's functional outcome.   REHAB POTENTIAL: Excellent  CLINICAL DECISION MAKING: Stable/uncomplicated  EVALUATION COMPLEXITY: Low   GOALS: Goals reviewed with patient? Yes  SHORT TERM GOALS: Target date: 12/12/2022    Will be compliant with appropriate progressive HEP Baseline: Goal status: MET 12/15/22  2.  PROM and AAROM of surgical shoulder to be at least 150* flexion and abduction, PROM and AAROM ER/IR to be at least 45* without increase in pain  Baseline:  Goal status: IN PROGRESS  3.  Pain in  surgical shoulder to be no more than 4/10 at worst  Baseline:  Goal status: IN PROGRESS  4.  Will be able to perform computer tasks on a Mod(I) basis with ergonomic and supportive adjustments PRN and no increase in pain  Baseline:  Goal status: INITIAL   LONG TERM GOALS: Target date: 01/09/2023    MMT surgical UE and periscap musculature  to be at least 4/5  Baseline:  Goal status: INITIAL  2.  Surgical UE AROM flexion and abduction to be full, ER/IR AROM to be at least 60 degrees  Baseline:  Goal status: INITIAL  3.  Will be able to perform all dressing/bathing/self care tasks on an independent basis  Baseline:  Goal status: IN PROGRESS  4.  Pain to be no more than 2/10 at worst Baseline:  Goal status: INITIAL  5.  Will tolerate light strengthening activities within protocol to assist in prep for return to sports specific tasks  Baseline:  Goal status: INITIAL  PLAN:  PT FREQUENCY:  3x/week for 4 weeks, then 2x/week for next 4 weeks   PT DURATION: 8 weeks  PLANNED INTERVENTIONS: Therapeutic exercises, Therapeutic activity, Neuromuscular re-education, Balance training, Gait training, Patient/Family education, Self Care, Joint mobilization, Aquatic Therapy, Dry Needling, Electrical stimulation, Cryotherapy, Moist heat, Taping, Ultrasound, Ionotophoresis 4mg /ml Dexamethasone, Manual therapy, and Re-evaluation  PLAN FOR NEXT SESSION: per protocol   Sherlynn Carbon, PTA    12/15/2022, 1:28 PM

## 2022-12-17 ENCOUNTER — Encounter (HOSPITAL_BASED_OUTPATIENT_CLINIC_OR_DEPARTMENT_OTHER): Payer: Self-pay | Admitting: Physical Therapy

## 2022-12-17 ENCOUNTER — Ambulatory Visit (HOSPITAL_BASED_OUTPATIENT_CLINIC_OR_DEPARTMENT_OTHER): Payer: No Typology Code available for payment source | Admitting: Physical Therapy

## 2022-12-17 ENCOUNTER — Ambulatory Visit (INDEPENDENT_AMBULATORY_CARE_PROVIDER_SITE_OTHER): Payer: No Typology Code available for payment source | Admitting: Orthopaedic Surgery

## 2022-12-17 DIAGNOSIS — M25611 Stiffness of right shoulder, not elsewhere classified: Secondary | ICD-10-CM

## 2022-12-17 DIAGNOSIS — S43431A Superior glenoid labrum lesion of right shoulder, initial encounter: Secondary | ICD-10-CM

## 2022-12-17 DIAGNOSIS — R600 Localized edema: Secondary | ICD-10-CM

## 2022-12-17 DIAGNOSIS — M6281 Muscle weakness (generalized): Secondary | ICD-10-CM

## 2022-12-17 DIAGNOSIS — M25511 Pain in right shoulder: Secondary | ICD-10-CM

## 2022-12-17 NOTE — Therapy (Unsigned)
OUTPATIENT PHYSICAL THERAPY SHOULDER TREATMENT    Patient Name: Andrew Cunningham MRN: [id number] DOB:1971/10/10, 52 y.o., male Today's Date: 12/15/2022  END OF SESSION:  PT End of Session - 12/15/22 1250     Visit Number 11    Number of Visits 21    Date for PT Re-Evaluation 01/09/23    Authorization Type [employer]    Authorization Time Period 11/14/22 to 01/23/23    PT Start Time 1150    PT Stop Time 1230    PT Time Calculation (min) 40 min    Activity Tolerance Patient tolerated treatment well    Behavior During Therapy WFL for tasks assessed/performed                  Past Medical History:  Diagnosis Date   Anxiety    per patient    Cervical spine arthritis    COVID-19    ED (erectile dysfunction)    GERD (gastroesophageal reflux disease)    per patient    Hyperlipemia    per patient    Hypertension    per patient    PONV (postoperative nausea and vomiting)    Situational stress    Past Surgical History:  Procedure Laterality Date   APPENDECTOMY  2005   [place] ARTHROSCOPY WITH LABRAL REPAIR Right 11/11/2022   Procedure: RIGHT SHOULDER ARTHROSCOPY WITH LABRAL REPAIR AND LOOSE BODY REMOVAL;  Surgeon: Vanetta Mulders, MD;  Location: [place];  Service: Orthopedics;  Laterality: Right;   Patient Active Problem List   Diagnosis Date Noted   Labral tear of shoulder, right, initial encounter 11/11/2022   Cervical spine arthritis 08/22/2020   History of anxiety 08/22/2020   History of gastroesophageal reflux (GERD) 08/22/2020   Essential hypertension 08/22/2020   History of hyperlipidemia 08/22/2020    PCP: [name] MD   REFERRING PROVIDER: Vanetta Mulders, MD  REFERRING DIAG:  [phone] (ICD-10-CM) - Labral tear of shoulder, right, initial encounter    THERAPY DIAG:  Stiffness of right shoulder, not elsewhere classified  Acute pain of right shoulder  Localized edema  Muscle weakness  (generalized)  Rationale for Evaluation and Treatment: Rehabilitation  ONSET DATE: 10/15/2022  SUBJECTIVE:                                                                                                                                                                                      SUBJECTIVE STATEMENT: The patient has been to the MD he is happy with his progress.  PERTINENT HISTORY: 52 y.o. male right-hand-dominant male presents with right shoulder pain and weakness now ongoing for 6 months.  At this time he has tried activity restriction and activity modification.  He has done physical therapy for strengthening of the shoulder.  He is trialed anti-inflammatories.  I did discuss that his exam is consistent with a labral tear with an intra-articular loose body.  Given the fact that there is a loose body we discussed the role for arthroscopic intervention and debridement with labral repair as needed.  I discussed the rehab associated with this.  I did also discuss an additional option would be to try steroid injection particularly as he has an upcoming trip to [place] for which she will be doing significant golfing.  I advised that this is also a possibility.  He would like to consider his options and he will call us back to let us know  PAIN:  Are you having pain? Yes: NPRS scale: 1-2/10 at rest  Pain location: R shoulder  Pain description: dull   Aggravating factors: jolts/bumping into stuff  Relieving factors: iceman, medicine  PRECAUTIONS: Other: NWB R UE, superior and inferior labral repair; PROM to tolerance per MD    WEIGHT BEARING RESTRICTIONS: Yes NWB RUE   FALLS:  Has patient fallen in last 6 months? 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 Sammuel Hines January 30th or 31st   OBJECTIVE:   DIAGNOSTIC FINDINGS:  IMPRESSION: 1. Rotator cuff tendinosis without tear. 2. Tear of the posterosuperior labrum. A multilobulated paralabral cyst extends along the posterior aspect of the glenohumeral joint extending towards the quadrilateral space measuring approximately 3.3 x 0.9 x 1.1 cm. 3. Mild degenerative changes of the [facility] joint with trace AC joint effusion. Mild pericapsular edema may be reactive or posttraumatic. 4. Trace subacromial-subdeltoid bursitis.  PROCEDURE: 1. Right shoulder superior labral repair 2. Right shoulder inferior labral repair 3. Right shoulder limited debridement  POSTOPERATIVE PLAN: He will be nonweight bearing on the right arm pending physical therapy. He will be mobilization and passive range of motion. He will be placed on aspirin for blood clot prevention  PATIENT SURVEYS:  FOTO will do 2nd session   COGNITION: Overall cognitive status: Within functional limits for tasks assessed     SENSATION: Not tested  POSTURE: Rounded shoulder, forward head   UPPER EXTREMITY ROM:   Passive ROM Right eval Left eval  Shoulder flexion 70*   Shoulder extension    Shoulder abduction 25*   Shoulder adduction    Shoulder internal rotation    Shoulder external rotation -45* limited by pain    Elbow flexion    Elbow extension    Wrist flexion    Wrist extension    Wrist ulnar deviation    Wrist radial deviation    Wrist pronation    Wrist supination    (Blank rows = not tested)  UPPER EXTREMITY MMT:  MMT Right eval Left eval  Shoulder flexion    Shoulder extension    Shoulder abduction    Shoulder adduction    Shoulder internal rotation    Shoulder external rotation    Middle trapezius    Lower  trapezius    Elbow flexion  Elbow extension    Wrist flexion    Wrist extension    Wrist ulnar deviation    Wrist radial deviation    Wrist pronation    Wrist supination    Grip strength (lbs)    (Blank rows = not tested)  DNT due to new surgery, restrictions of protocol     TODAY'S TREATMENT:                                                                                                                                         DATE:  2/21  PROM R shoulder Wand flexion x20 2 lbs Pulley flexion 2 min  Scpation 2 min  SL ER 2x15 reviewed progression of exercises   Scap retraction red 2x15 red  Shoulder extension 2x15 red   Standing flexion 2x10  Standing scaption x10 all with cuing to stay pain free   2/19 PROM R shoulder Wand flexion x20 1 lbs Wand abduction x10 1lb bar  ABC 2x  1lbs Active supine flexion x5 (fatigued quickly)  Bicep curl 3lb 2x10  STM to lats 3' (Very tight/tender)  Finger ladder 2x    Pulley flexion 2 min  Scpation 2 min   2/16 Wand flexion x20 1 lbs  ABC 2x   Bicep curl 2lb 2x10  Prone row 2x15  Prone extension 2x15   Finger ladder 2x   SL ER 2x10   Pulley flexion 2 min  Scpation 2 min   2/14  Manual: Flexion, ABD, ER, IR PROM as tolerated   Active supine flexion x10 Wand flexion 1 lb 2x10   Prone: scap retraction 2x10- 5" hold Extension 2x15   Bicep curl 2lb 2x10  Isometrics-felx,abd,IR,ER 5" 1x10 ea  Finger ladder 2x    PATIENT EDUCATION: Education details: intervention purpose  Person educated: Patient and Spouse Education method: Explanation, Demonstration, and Handouts 3Education comprehension: verbalized understanding and returned demonstration  HOME EXERCISE PROGRAM: Access Code: [email or web address] URL: [email or web address] Date: 11/14/2022 Prepared by: Deniece Ree  Exercises - Flexion-Extension Shoulder Pendulum with Table Support  - 1 x daily - 7 x weekly - 3 sets - 10 reps - Horizontal  Shoulder Pendulum with Table Support  - 1 x daily - 7 x weekly - 3 sets - 10 reps - Seated Elbow Flexion and Extension AROM  - 1 x daily - 7 x weekly - 3 sets - 10 reps - Wrist Flexion Extension AROM with Fingers Curled and Palm Down  - 1 x daily - 7 x weekly - 3 sets - 10 reps - Seated Gripping Towel  - 1 x daily - 7 x weekly - 3 sets - 10 reps - Thumb AROM Opposition To All Fingers  - 1 x daily - 7 x weekly - 3 sets - 10 reps  ASSESSMENT:  CLINICAL IMPRESSION: The patient will be on vacation for 2.5 weeks. He was given the exercises  that he will be able to progress into over the next few weeks. He was given bands for scpapular strengthening as these will be easier to travel with. Overall he is making great progress. We will re-assess when he gets back.   OBJECTIVE IMPAIRMENTS: decreased knowledge of condition, decreased ROM, decreased strength, hypomobility, increased edema, increased fascial restrictions, increased muscle spasms, impaired flexibility, impaired UE functional use, and pain.   ACTIVITY LIMITATIONS: carrying, lifting, bathing, toileting, dressing, self feeding, reach over head, and hygiene/grooming  PARTICIPATION LIMITATIONS: meal prep, cleaning, laundry, medication management, interpersonal relationship, driving, shopping, community activity, and occupation  PERSONAL FACTORS: Age, Behavior pattern, Education, Fitness, and Past/current experiences are also affecting patient's functional outcome.   REHAB POTENTIAL: Excellent  CLINICAL DECISION MAKING: Stable/uncomplicated  EVALUATION COMPLEXITY: Low   GOALS: Goals reviewed with patient? Yes  SHORT TERM GOALS: Target date: 12/12/2022    Will be compliant with appropriate progressive HEP Baseline: Goal status: MET 12/15/22  2.  PROM and AAROM of surgical shoulder to be at least 150* flexion and abduction, PROM and AAROM ER/IR to be at least 45* without increase in pain  Baseline:  Goal status: IN PROGRESS  3.  Pain  in surgical shoulder to be no more than 4/10 at worst  Baseline:  Goal status: IN PROGRESS  4.  Will be able to perform computer tasks on a Mod(I) basis with ergonomic and supportive adjustments PRN and no increase in pain  Baseline:  Goal status: INITIAL   LONG TERM GOALS: Target date: 01/09/2023    MMT surgical UE and periscap musculature  to be at least 4/5  Baseline:  Goal status: INITIAL  2.  Surgical UE AROM flexion and abduction to be full, ER/IR AROM to be at least 60 degrees  Baseline:  Goal status: INITIAL  3.  Will be able to perform all dressing/bathing/self care tasks on an independent basis  Baseline:  Goal status: IN PROGRESS  4.  Pain to be no more than 2/10 at worst Baseline:  Goal status: INITIAL  5.  Will tolerate light strengthening activities within protocol to assist in prep for return to sports specific tasks  Baseline:  Goal status: INITIAL  PLAN:  PT FREQUENCY:  3x/week for 4 weeks, then 2x/week for next 4 weeks   PT DURATION: 8 weeks  PLANNED INTERVENTIONS: Therapeutic exercises, Therapeutic activity, Neuromuscular re-education, Balance training, Gait training, Patient/Family education, Self Care, Joint mobilization, Aquatic Therapy, Dry Needling, Electrical stimulation, Cryotherapy, Moist heat, Taping, Ultrasound, Ionotophoresis 4mg /ml Dexamethasone, Manual therapy, and Re-evaluation  PLAN FOR NEXT SESSION: per protocol        Carolyne Littles PT DPT  12/15/2022, 1:28 PM

## 2022-12-17 NOTE — Progress Notes (Signed)
Post Operative Evaluation    Procedure/Date of Surgery: Right shoulder labral repair 1/16  Interval History:    Presents 5 weeks out status post right shoulder labral repair.  Overall he is still having some stiffness although he is quite satisfied with his pain.  His motion is improving.  He continues to work with physical therapy.  He is weaned out of his sling at this time.  PMH/PSH/Family History/Social History/Meds/Allergies:    Past Medical History:  Diagnosis Date   Anxiety    per patient    Cervical spine arthritis    COVID-19    ED (erectile dysfunction)    GERD (gastroesophageal reflux disease)    per patient    Hyperlipemia    per patient    Hypertension    per patient    PONV (postoperative nausea and vomiting)    Situational stress    Past Surgical History:  Procedure Laterality Date   APPENDECTOMY  2005   [place]   SHOULDER ARTHROSCOPY WITH LABRAL REPAIR Right 11/11/2022   Procedure: RIGHT SHOULDER ARTHROSCOPY WITH LABRAL REPAIR AND LOOSE BODY REMOVAL;  Surgeon: Vanetta Mulders, MD;  Location: [facility];  Service: Orthopedics;  Laterality: Right;   Social History   Socioeconomic History   Marital status: Married    Spouse name: Not on file   Number of children: Not on file   Years of education: Not on file   Highest education level: Not on file  Occupational History   Not on file  Tobacco Use   Smoking status: Former   Smokeless tobacco: Former    Types: Nurse, children's Use: Never used  Substance and Sexual Activity   Alcohol use: Yes    Comment: 2 drinks daily    Drug use: Not Currently   Sexual activity: Not on file  Other Topics Concern   Not on file  Social History Narrative   Not on file   Social Determinants of Health   Financial Resource Strain: Not on file  Food Insecurity: Not on file  Transportation Needs: Not on file  Physical Activity: Not on file   Stress: Not on file  Social Connections: Not on file   Family History  Problem Relation Age of Onset   Healthy Mother    Arthritis Mother    Healthy Father    Healthy Sister    Healthy Daughter    No Known Allergies Current Outpatient Medications  Medication Sig Dispense Refill   ALPRAZolam (XANAX) 0.5 MG tablet Take 0.5 mg by mouth daily as needed.     aspirin EC 325 MG tablet Take 1 tablet (325 mg total) by mouth daily. 30 tablet 0   calcium-vitamin D (OSCAL WITH D) 500-5 MG-MCG tablet Take 1 tablet by mouth.     cetirizine (ZYRTEC) 10 MG tablet Take 10 mg by mouth daily.     influenza vac split quadrivalent PF (FLUARIX) 0.5 ML injection Inject into the muscle. 0.5 mL 0   losartan (COZAAR) 50 MG tablet Take 50 mg by mouth daily.     omeprazole (PRILOSEC) 10 MG capsule Take 10 mg by mouth daily.     oxyCODONE (ROXICODONE) 5 MG immediate release tablet Take 1 tablet (5 mg total) by mouth every 4 (four) hours as  needed for severe pain or breakthrough pain. 30 tablet 0   propranolol (INDERAL) 20 MG tablet Take 20 mg by mouth as needed.     rosuvastatin (CRESTOR) 5 MG tablet Take 5 mg by mouth daily.     vitamin B-12 (CYANOCOBALAMIN) 100 MCG tablet Take 100 mcg by mouth daily.     No current facility-administered medications for this visit.   No results found.  Review of Systems:   A ROS was performed including pertinent positives and negatives as documented in the HPI.   Musculoskeletal Exam:     Right shoulder incisions are healed.  In the sitting position he is able to forward elevate to 130 degrees with external rotation at the side to 50 degrees.  Internal rotation is to L5.  Distal neurosensory exam is intact  Imaging:      I personally reviewed and interpreted the radiographs.   Assessment:   5 weeks status post right shoulder posterior labral repair overall doing extremely well.  At this time he will continue to work on active range of motion.  I have advised him  on restrictions against heavy lifting.  All limitations and restrictions were discussed.  I will plan to see him back in 6 weeks for reassessment  Plan :    -Return to clinic in 6 weeks for reassessment      I personally saw and evaluated the patient, and participated in the management and treatment plan.  Vanetta Mulders, MD Attending Physician, Orthopedic Surgery  This document was dictated using Dragon voice recognition software. A reasonable attempt at proof reading has been made to minimize errors.

## 2022-12-18 ENCOUNTER — Encounter (HOSPITAL_BASED_OUTPATIENT_CLINIC_OR_DEPARTMENT_OTHER): Payer: Self-pay | Admitting: Physical Therapy

## 2023-01-05 ENCOUNTER — Ambulatory Visit (HOSPITAL_BASED_OUTPATIENT_CLINIC_OR_DEPARTMENT_OTHER): Payer: No Typology Code available for payment source | Attending: Orthopaedic Surgery | Admitting: Physical Therapy

## 2023-01-05 ENCOUNTER — Encounter (HOSPITAL_BASED_OUTPATIENT_CLINIC_OR_DEPARTMENT_OTHER): Payer: Self-pay | Admitting: Physical Therapy

## 2023-01-05 DIAGNOSIS — M25611 Stiffness of right shoulder, not elsewhere classified: Secondary | ICD-10-CM | POA: Diagnosis not present

## 2023-01-05 DIAGNOSIS — R600 Localized edema: Secondary | ICD-10-CM | POA: Diagnosis present

## 2023-01-05 DIAGNOSIS — M25511 Pain in right shoulder: Secondary | ICD-10-CM | POA: Insufficient documentation

## 2023-01-05 DIAGNOSIS — M6281 Muscle weakness (generalized): Secondary | ICD-10-CM | POA: Diagnosis present

## 2023-01-05 NOTE — Therapy (Signed)
OUTPATIENT PHYSICAL THERAPY SHOULDER TREATMENT    Patient Name: Andrew Cunningham MRN: [id number] DOB:Apr 20, 1971, 52 y.o., male Today's Date: 12/15/2022  END OF SESSION:  PT End of Session - 12/15/22 1250     Visit Number 11    Number of Visits 21    Date for PT Re-Evaluation 01/09/23    Authorization Type [employer]    Authorization Time Period 11/14/22 to 01/23/23    PT Start Time 1150    PT Stop Time 1230    PT Time Calculation (min) 40 min    Activity Tolerance Patient tolerated treatment well    Behavior During Therapy WFL for tasks assessed/performed                  Past Medical History:  Diagnosis Date   Anxiety    per patient    Cervical spine arthritis    COVID-19    ED (erectile dysfunction)    GERD (gastroesophageal reflux disease)    per patient    Hyperlipemia    per patient    Hypertension    per patient    PONV (postoperative nausea and vomiting)    Situational stress    Past Surgical History:  Procedure Laterality Date   APPENDECTOMY  2005   [place] ARTHROSCOPY WITH LABRAL REPAIR Right 11/11/2022   Procedure: RIGHT SHOULDER ARTHROSCOPY WITH LABRAL REPAIR AND LOOSE BODY REMOVAL;  Surgeon: Vanetta Mulders, MD;  Location: [facility];  Service: Orthopedics;  Laterality: Right;   Patient Active Problem List   Diagnosis Date Noted   Labral tear of shoulder, right, initial encounter 11/11/2022   Cervical spine arthritis 08/22/2020   History of anxiety 08/22/2020   History of gastroesophageal reflux (GERD) 08/22/2020   Essential hypertension 08/22/2020   History of hyperlipidemia 08/22/2020    PCP: [name] MD   REFERRING PROVIDER: Vanetta Mulders, MD  REFERRING DIAG:  [phone] (ICD-10-CM) - Labral tear of shoulder, right, initial encounter    THERAPY DIAG:  Stiffness of right shoulder, not elsewhere classified  Acute pain of right shoulder  Localized edema  Muscle weakness  (generalized)  Rationale for Evaluation and Treatment: Rehabilitation  ONSET DATE: 10/15/2022  SUBJECTIVE:                                                                                                                                                                                      SUBJECTIVE STATEMENT:  The patient has been on vacation for 2 weeks. He has been doing his exercises. He reports very little pain. He is still taking OTC medication to control pain and inflammation.  PERTINENT  HISTORY: 52 y.o. male right-hand-dominant male presents with right shoulder pain and weakness now ongoing for 6 months.  At this time he has tried activity restriction and activity modification.  He has done physical therapy for strengthening of the shoulder.  He is trialed anti-inflammatories.  I did discuss that his exam is consistent with a labral tear with an intra-articular loose body.  Given the fact that there is a loose body we discussed the role for arthroscopic intervention and debridement with labral repair as needed.  I discussed the rehab associated with this.  I did also discuss an additional option would be to try steroid injection particularly as he has an upcoming trip to [place] for which she will be doing significant golfing.  I advised that this is also a possibility.  He would like to consider his options and he will call us back to let us know  PAIN:  Are you having pain? Yes: NPRS scale: 1-2/10 at rest  Pain location: R shoulder  Pain description: dull   Aggravating factors: jolts/bumping into stuff  Relieving factors: iceman, medicine  PRECAUTIONS: Other: NWB R UE, superior and inferior labral repair; PROM to tolerance per MD    WEIGHT BEARING RESTRICTIONS: Yes NWB RUE   FALLS:  Has patient fallen in last 6 months? 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 Sammuel Hines January 30th or 31st   OBJECTIVE:   DIAGNOSTIC FINDINGS:  IMPRESSION: 1. Rotator cuff tendinosis without tear. 2. Tear of the posterosuperior labrum. A multilobulated paralabral cyst extends along the posterior aspect of the glenohumeral joint extending towards the quadrilateral space measuring approximately 3.3 x 0.9 x 1.1 cm. 3. Mild degenerative changes of the [facility] joint with trace AC joint effusion. Mild pericapsular edema may be reactive or posttraumatic. 4. Trace subacromial-subdeltoid bursitis.  PROCEDURE: 1. Right shoulder superior labral repair 2. Right shoulder inferior labral repair 3. Right shoulder limited debridement  POSTOPERATIVE PLAN: He will be nonweight bearing on the right arm pending physical therapy. He will be mobilization and passive range of motion. He will be placed on aspirin for blood clot prevention  PATIENT SURVEYS:  FOTO will do 2nd session   COGNITION: Overall cognitive status: Within functional limits for tasks assessed     SENSATION: Not tested  POSTURE: Rounded shoulder, forward head   UPPER EXTREMITY ROM:   Passive ROM Right eval Left eval  Shoulder flexion 70*   Shoulder extension    Shoulder abduction 25*   Shoulder adduction    Shoulder internal rotation    Shoulder external rotation -45* limited by pain    Elbow flexion    Elbow extension    Wrist flexion    Wrist extension    Wrist ulnar deviation    Wrist radial deviation    Wrist pronation    Wrist supination    (Blank rows = not tested)  UPPER EXTREMITY MMT:  MMT Right eval Left eval  Shoulder flexion    Shoulder extension    Shoulder abduction     Shoulder adduction    Shoulder internal rotation  Shoulder external rotation    Middle trapezius    Lower trapezius    Elbow flexion    Elbow extension    Wrist flexion    Wrist extension    Wrist ulnar deviation    Wrist radial deviation    Wrist pronation    Wrist supination    Grip strength (lbs)    (Blank rows = not tested)  DNT due to new surgery, restrictions of protocol     TODAY'S TREATMENT:                                                                                                                                         DATE:  3/11 Wand flexion x20 2 lbs Pulley flexion 2 min   SL ER 2x15 2lbs   Chop 2x10   Standing forward flexion 2x10 2lbs  Standing scaption 2x10 2lbs   Row machine 2x15  Shoulder extension 2x15 10 lbs   PROM into flexion and ER: PROM into all planes; Grade II and III PA and AP mobilization    2/21  PROM R shoulder Wand flexion x20 2 lbs Pulley flexion 2 min  Scpation 2 min  SL ER 2x15 reviewed progression of exercises   Scap retraction red 2x15 red  Shoulder extension 2x15 red   Standing flexion 2x10  Standing scaption x10 all with cuing to stay pain free   2/19 PROM R shoulder Wand flexion x20 1 lbs Wand abduction x10 1lb bar  ABC 2x  1lbs Active supine flexion x5 (fatigued quickly)  Bicep curl 3lb 2x10  STM to lats 3' (Very tight/tender)  Finger ladder 2x    Pulley flexion 2 min  Scpation 2 min   2/16 Wand flexion x20 1 lbs  ABC 2x   Bicep curl 2lb 2x10  Prone row 2x15  Prone extension 2x15   Finger ladder 2x   SL ER 2x10   Pulley flexion 2 min  Scpation 2 min   2/14  Manual: Flexion, ABD, ER, IR PROM as tolerated   Active supine flexion x10 Wand flexion 1 lb 2x10   Prone: scap retraction 2x10- 5" hold Extension 2x15   Bicep curl 2lb 2x10  Isometrics-felx,abd,IR,ER 5" 1x10 ea  Finger ladder 2x    PATIENT EDUCATION: Education details: intervention purpose  Person educated:  Patient and Spouse Education method: Explanation, Demonstration, and Handouts 3Education comprehension: verbalized understanding and returned demonstration  HOME EXERCISE PROGRAM: Access Code: [email or web address] URL: [email or web address] Date: 11/14/2022 Prepared by: Deniece Ree  Exercises - Flexion-Extension Shoulder Pendulum with Table Support  - 1 x daily - 7 x weekly - 3 sets - 10 reps - Horizontal Shoulder Pendulum with Table Support  - 1 x daily - 7 x weekly - 3 sets - 10 reps - Seated Elbow Flexion and Extension AROM  - 1 x daily - 7 x weekly - 3 sets - 10 reps -  Wrist Flexion Extension AROM with Fingers Curled and Palm Down  - 1 x daily - 7 x weekly - 3 sets - 10 reps - Seated Gripping Towel  - 1 x daily - 7 x weekly - 3 sets - 10 reps - Thumb AROM Opposition To All Fingers  - 1 x daily - 7 x weekly - 3 sets - 10 reps  ASSESSMENT:  CLINICAL IMPRESSION: The patient returns from vacation He is doing very well. He has full ROM. We advanced some of the weights on his exercises. He hopes to return to golf when able. He would also like to return to tennis. He was given golf specific exercises today. We also started light gym exercises below 90 degrees OBJECTIVE IMPAIRMENTS: decreased knowledge of condition, decreased ROM, decreased strength, hypomobility, increased edema, increased fascial restrictions, increased muscle spasms, impaired flexibility, impaired UE functional use, and pain.   ACTIVITY LIMITATIONS: carrying, lifting, bathing, toileting, dressing, self feeding, reach over head, and hygiene/grooming  PARTICIPATION LIMITATIONS: meal prep, cleaning, laundry, medication management, interpersonal relationship, driving, shopping, community activity, and occupation  PERSONAL FACTORS: Age, Behavior pattern, Education, Fitness, and Past/current experiences are also affecting patient's functional outcome.   REHAB POTENTIAL: Excellent  CLINICAL DECISION MAKING:  Stable/uncomplicated  EVALUATION COMPLEXITY: Low   GOALS: Goals reviewed with patient? Yes  SHORT TERM GOALS: Target date: 12/12/2022    Will be compliant with appropriate progressive HEP Baseline: Goal status: MET 12/15/22  2.  PROM and AAROM of surgical shoulder to be at least 150* flexion and abduction, PROM and AAROM ER/IR to be at least 45* without increase in pain  Baseline:  Goal status: IN PROGRESS  3.  Pain in surgical shoulder to be no more than 4/10 at worst  Baseline:  Goal status: IN PROGRESS  4.  Will be able to perform computer tasks on a Mod(I) basis with ergonomic and supportive adjustments PRN and no increase in pain  Baseline:  Goal status: INITIAL   LONG TERM GOALS: Target date: 01/09/2023    MMT surgical UE and periscap musculature  to be at least 4/5  Baseline:  Goal status: INITIAL  2.  Surgical UE AROM flexion and abduction to be full, ER/IR AROM to be at least 60 degrees  Baseline:  Goal status: INITIAL  3.  Will be able to perform all dressing/bathing/self care tasks on an independent basis  Baseline:  Goal status: IN PROGRESS  4.  Pain to be no more than 2/10 at worst Baseline:  Goal status: INITIAL  5.  Will tolerate light strengthening activities within protocol to assist in prep for return to sports specific tasks  Baseline:  Goal status: INITIAL  PLAN:  PT FREQUENCY:  3x/week for 4 weeks, then 2x/week for next 4 weeks   PT DURATION: 8 weeks  PLANNED INTERVENTIONS: Therapeutic exercises, Therapeutic activity, Neuromuscular re-education, Balance training, Gait training, Patient/Family education, Self Care, Joint mobilization, Aquatic Therapy, Dry Needling, Electrical stimulation, Cryotherapy, Moist heat, Taping, Ultrasound, Ionotophoresis '4mg'$ /ml Dexamethasone, Manual therapy, and Re-evaluation  PLAN FOR NEXT SESSION: per protocol        Carolyne Littles PT DPT  12/15/2022, 1:28 PM

## 2023-01-07 ENCOUNTER — Ambulatory Visit (HOSPITAL_BASED_OUTPATIENT_CLINIC_OR_DEPARTMENT_OTHER): Payer: No Typology Code available for payment source

## 2023-01-07 ENCOUNTER — Encounter (HOSPITAL_BASED_OUTPATIENT_CLINIC_OR_DEPARTMENT_OTHER): Payer: Self-pay

## 2023-01-07 DIAGNOSIS — M25611 Stiffness of right shoulder, not elsewhere classified: Secondary | ICD-10-CM

## 2023-01-07 DIAGNOSIS — R600 Localized edema: Secondary | ICD-10-CM

## 2023-01-07 DIAGNOSIS — M25511 Pain in right shoulder: Secondary | ICD-10-CM

## 2023-01-07 DIAGNOSIS — M6281 Muscle weakness (generalized): Secondary | ICD-10-CM

## 2023-01-07 NOTE — Therapy (Signed)
OUTPATIENT PHYSICAL THERAPY SHOULDER TREATMENT    Patient Name: Andrew Cunningham MRN: [id number] DOB:[birth year], 52 y.o., male Today's Date: 01/07/2023  END OF SESSION:  PT End of Session - 01/07/23 0954     Visit Number 15    Number of Visits 21    Date for PT Re-Evaluation 01/09/23                   Past Medical History:  Diagnosis Date   Anxiety    per patient    Cervical spine arthritis    COVID-19    ED (erectile dysfunction)    GERD (gastroesophageal reflux disease)    per patient    Hyperlipemia    per patient    Hypertension    per patient    PONV (postoperative nausea and vomiting)    Situational stress    Past Surgical History:  Procedure Laterality Date   APPENDECTOMY  2005   [place] ARTHROSCOPY WITH LABRAL REPAIR Right 11/11/2022   Procedure: RIGHT SHOULDER ARTHROSCOPY WITH LABRAL REPAIR AND LOOSE BODY REMOVAL;  Surgeon: Vanetta Mulders, MD;  Location: [place];  Service: Orthopedics;  Laterality: Right;   Patient Active Problem List   Diagnosis Date Noted   Labral tear of shoulder, right, initial encounter 11/11/2022   Cervical spine arthritis 08/22/2020   History of anxiety 08/22/2020   History of gastroesophageal reflux (GERD) 08/22/2020   Essential hypertension 08/22/2020   History of hyperlipidemia 08/22/2020    PCP: [name] MD   REFERRING PROVIDER: Vanetta Mulders, MD  REFERRING DIAG:  [phone] (ICD-10-CM) - Labral tear of shoulder, right, initial encounter    THERAPY DIAG:  Stiffness of right shoulder, not elsewhere classified  Localized edema  Muscle weakness (generalized)  Acute pain of right shoulder  Rationale for Evaluation and Treatment: Rehabilitation  ONSET DATE: 10/15/2022  SUBJECTIVE:                                                                                                                                                                                       SUBJECTIVE STATEMENT:  Pt reports his shoulder has been doing well. 1/10 pain level at rest.  PERTINENT HISTORY: 52 y.o. male right-hand-dominant male presents with right shoulder pain and weakness now ongoing for 6 months.  At this time he has tried activity restriction and activity modification.  He has done physical therapy for strengthening of the shoulder.  He is trialed anti-inflammatories.  I did discuss that his exam is consistent with a labral tear with an intra-articular loose body.  Given the fact that there is a loose body  we discussed the role for arthroscopic intervention and debridement with labral repair as needed.  I discussed the rehab associated with this.  I did also discuss an additional option would be to try steroid injection particularly as he has an upcoming trip to [place] for which she will be doing significant golfing.  I advised that this is also a possibility.  He would like to consider his options and he will call us back to let us know  PAIN:  Are you having pain? Yes: NPRS scale: 1-2/10 at rest  Pain location: R shoulder  Pain description: dull   Aggravating factors: jolts/bumping into stuff  Relieving factors: iceman, medicine  PRECAUTIONS: Other: NWB R UE, superior and inferior labral repair; PROM to tolerance per MD    WEIGHT BEARING RESTRICTIONS: Yes NWB RUE   FALLS:  Has patient fallen in last 6 months? 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 Sammuel Hines January 30th or 31st   OBJECTIVE:   DIAGNOSTIC FINDINGS:  IMPRESSION: 1.  Rotator cuff tendinosis without tear. 2. Tear of the posterosuperior labrum. A multilobulated paralabral cyst extends along the posterior aspect of the glenohumeral joint extending towards the quadrilateral space measuring approximately 3.3 x 0.9 x 1.1 cm. 3. Mild degenerative changes of the [facility] joint with trace AC joint effusion. Mild pericapsular edema may be reactive or posttraumatic. 4. Trace subacromial-subdeltoid bursitis.  PROCEDURE: 1. Right shoulder superior labral repair 2. Right shoulder inferior labral repair 3. Right shoulder limited debridement  POSTOPERATIVE PLAN: He will be nonweight bearing on the right arm pending physical therapy. He will be mobilization and passive range of motion. He will be placed on aspirin for blood clot prevention  PATIENT SURVEYS:  FOTO will do 2nd session   COGNITION: Overall cognitive status: Within functional limits for tasks assessed     SENSATION: Not tested  POSTURE: Rounded shoulder, forward head   UPPER EXTREMITY ROM:   Passive ROM Right eval Left eval  Shoulder flexion 70*   Shoulder extension    Shoulder abduction 25*   Shoulder adduction    Shoulder internal rotation    Shoulder external rotation -45* limited by pain    Elbow flexion    Elbow extension    Wrist flexion    Wrist extension    Wrist ulnar deviation    Wrist radial deviation    Wrist pronation    Wrist supination    (Blank rows = not tested)  UPPER EXTREMITY MMT:  MMT Right eval Left eval  Shoulder flexion    Shoulder extension    Shoulder abduction    Shoulder adduction    Shoulder internal rotation    Shoulder external rotation    Middle trapezius    Lower trapezius    Elbow flexion    Elbow extension    Wrist flexion    Wrist extension    Wrist ulnar deviation    Wrist radial deviation    Wrist pronation    Wrist supination    Grip strength (lbs)    (Blank rows = not tested)  DNT due to new surgery,  restrictions of protocol      TODAY'S TREATMENT:                                                                                                                                         DATE:   3/13 Wand flexion x20 2 lbs Pulley flexion 2 min   SL ER 2x10, 1x15 2lbs   Standing forward flexion 2x10 2lbs  Standing scaption 2x10 2lbs   Row at cable column 1x10 15lbs, 2x10 20lbs  IR behind back with wand, 5" x10 Extension behind back with wand x10  PROM into all planes   3/11 Wand flexion x20 2 lbs Pulley flexion 2 min   SL ER 2x15 2lbs   Chop 2x10   Standing forward flexion 2x10 2lbs  Standing scaption 2x10 2lbs Standing abduction 0lbs 2x10   Row machine 2x15  Shoulder extension 2x15 10 lbs   PROM into flexion and ER: PROM into all planes; Grade II and III PA and AP mobilization    2/21  PROM R shoulder Wand flexion x20 2 lbs Pulley flexion 2 min  Scpation 2 min  SL ER 2x15 reviewed progression of exercises   Scap retraction red 2x15 red  Shoulder extension 2x15 red   Standing flexion 2x10  Standing scaption x10 all with cuing to stay pain free   2/19 PROM R shoulder Wand flexion x20 1 lbs Wand abduction x10 1lb bar  ABC 2x  1lbs Active supine flexion x5 (fatigued quickly)  Bicep curl 3lb 2x10  STM to lats 3' (Very tight/tender)  Finger ladder 2x    Pulley flexion 2 min  Scpation 2 min   2/16 Wand flexion x20 1 lbs  ABC 2x   Bicep curl 2lb 2x10  Prone row 2x15  Prone extension 2x15   Finger ladder 2x   SL ER 2x10   Pulley flexion 2 min  Scpation 2 min   2/14  Manual: Flexion, ABD, ER, IR PROM as tolerated   Active supine flexion x10 Wand flexion 1 lb 2x10   Prone: scap retraction 2x10- 5" hold Extension 2x15   Bicep curl 2lb 2x10  Isometrics-felx,abd,IR,ER 5" 1x10 ea  Finger ladder 2x    PATIENT EDUCATION: Education details: intervention purpose  Person educated: Patient and Spouse Education method: Explanation, Demonstration, and  Handouts 3Education comprehension: verbalized understanding and returned demonstration  HOME EXERCISE PROGRAM: Access Code: [email or web address] URL: [email or web address] Date: 11/14/2022 Prepared by: Deniece Ree  Exercises - Flexion-Extension Shoulder Pendulum with Table Support  - 1 x daily - 7 x weekly - 3 sets - 10 reps - Horizontal Shoulder Pendulum with Table Support  - 1 x daily - 7 x weekly - 3 sets - 10 reps - Seated Elbow Flexion and Extension AROM  - 1 x daily - 7 x weekly - 3 sets - 10 reps - Wrist Flexion Extension AROM with Fingers Curled and  Palm Down  - 1 x daily - 7 x weekly - 3 sets - 10 reps - Seated Gripping Towel  - 1 x daily - 7 x weekly - 3 sets - 10 reps - Thumb AROM Opposition To All Fingers  - 1 x daily - 7 x weekly - 3 sets - 10 reps  ASSESSMENT:  CLINICAL IMPRESSION: Pt now 8 weeks s/p. Pt is progressing very well with R shoulder. He is nearing full ROM in most planes, with IR being most restricted. Instructed pt in IR stretch behind back staying within pain limitations. He has no difficulty with scaption in standing, but did have some difficulty with abduction. Pt is interested in decreasing PT to 1x/week if appropriate. He will have re-eval next visit.   OBJECTIVE IMPAIRMENTS: decreased knowledge of condition, decreased ROM, decreased strength, hypomobility, increased edema, increased fascial restrictions, increased muscle spasms, impaired flexibility, impaired UE functional use, and pain.   ACTIVITY LIMITATIONS: carrying, lifting, bathing, toileting, dressing, self feeding, reach over head, and hygiene/grooming  PARTICIPATION LIMITATIONS: meal prep, cleaning, laundry, medication management, interpersonal relationship, driving, shopping, community activity, and occupation  PERSONAL FACTORS: Age, Behavior pattern, Education, Fitness, and Past/current experiences are also affecting patient's functional outcome.   REHAB POTENTIAL: Excellent  CLINICAL  DECISION MAKING: Stable/uncomplicated  EVALUATION COMPLEXITY: Low   GOALS: Goals reviewed with patient? Yes  SHORT TERM GOALS: Target date: 12/12/2022    Will be compliant with appropriate progressive HEP Baseline: Goal status: MET 12/15/22  2.  PROM and AAROM of surgical shoulder to be at least 150* flexion and abduction, PROM and AAROM ER/IR to be at least 45* without increase in pain  Baseline:  Goal status: IN PROGRESS  3.  Pain in surgical shoulder to be no more than 4/10 at worst  Baseline:  Goal status: IN PROGRESS  4.  Will be able to perform computer tasks on a Mod(I) basis with ergonomic and supportive adjustments PRN and no increase in pain  Baseline:  Goal status: INITIAL   LONG TERM GOALS: Target date: 01/09/2023    MMT surgical UE and periscap musculature  to be at least 4/5  Baseline:  Goal status: INITIAL  2.  Surgical UE AROM flexion and abduction to be full, ER/IR AROM to be at least 60 degrees  Baseline:  Goal status: IN PROGRESS  3.  Will be able to perform all dressing/bathing/self care tasks on an independent basis  Baseline:  Goal status: IN PROGRESS  4.  Pain to be no more than 2/10 at worst Baseline:  Goal status: INITIAL  5.  Will tolerate light strengthening activities within protocol to assist in prep for return to sports specific tasks  Baseline:  Goal status: INITIAL  PLAN:  PT FREQUENCY:  3x/week for 4 weeks, then 2x/week for next 4 weeks   PT DURATION: 8 weeks  PLANNED INTERVENTIONS: Therapeutic exercises, Therapeutic activity, Neuromuscular re-education, Balance training, Gait training, Patient/Family education, Self Care, Joint mobilization, Aquatic Therapy, Dry Needling, Electrical stimulation, Cryotherapy, Moist heat, Taping, Ultrasound, Ionotophoresis '4mg'$ /ml Dexamethasone, Manual therapy, and Re-evaluation  PLAN FOR NEXT SESSION: per protocol       Sherlynn Carbon, PTA  01/07/2023, 12:02 PM

## 2023-01-12 ENCOUNTER — Encounter (HOSPITAL_BASED_OUTPATIENT_CLINIC_OR_DEPARTMENT_OTHER): Payer: No Typology Code available for payment source | Admitting: Physical Therapy

## 2023-01-14 ENCOUNTER — Ambulatory Visit (HOSPITAL_BASED_OUTPATIENT_CLINIC_OR_DEPARTMENT_OTHER): Payer: No Typology Code available for payment source | Admitting: Physical Therapy

## 2023-01-14 ENCOUNTER — Encounter (HOSPITAL_BASED_OUTPATIENT_CLINIC_OR_DEPARTMENT_OTHER): Payer: Self-pay | Admitting: Physical Therapy

## 2023-01-14 DIAGNOSIS — R600 Localized edema: Secondary | ICD-10-CM

## 2023-01-14 DIAGNOSIS — M25511 Pain in right shoulder: Secondary | ICD-10-CM

## 2023-01-14 DIAGNOSIS — M6281 Muscle weakness (generalized): Secondary | ICD-10-CM

## 2023-01-14 DIAGNOSIS — M25611 Stiffness of right shoulder, not elsewhere classified: Secondary | ICD-10-CM | POA: Diagnosis not present

## 2023-01-14 NOTE — Therapy (Signed)
OUTPATIENT PHYSICAL THERAPY SHOULDER TREATMENT    Patient Name: Andrew Cunningham MRN: [id number] DOB:1971-03-31, 52 y.o., male Today's Date: 01/14/2023  END OF SESSION:  PT End of Session - 01/14/23 0938     Visit Number 16    Number of Visits --    Date for PT Re-Evaluation 03/27/23    Authorization Type [employer]    Authorization Time Period 11/14/22 to 01/23/23    PT Start Time 0935    PT Stop Time 1017    PT Time Calculation (min) 42 min    Activity Tolerance Patient tolerated treatment well    Behavior During Therapy WFL for tasks assessed/performed                   Past Medical History:  Diagnosis Date   Anxiety    per patient    Cervical spine arthritis    COVID-19    ED (erectile dysfunction)    GERD (gastroesophageal reflux disease)    per patient    Hyperlipemia    per patient    Hypertension    per patient    PONV (postoperative nausea and vomiting)    Situational stress    Past Surgical History:  Procedure Laterality Date   APPENDECTOMY  2005   [place] ARTHROSCOPY WITH LABRAL REPAIR Right 11/11/2022   Procedure: RIGHT SHOULDER ARTHROSCOPY WITH LABRAL REPAIR AND LOOSE BODY REMOVAL;  Surgeon: Vanetta Mulders, MD;  Location: [place];  Service: Orthopedics;  Laterality: Right;   Patient Active Problem List   Diagnosis Date Noted   Labral tear of shoulder, right, initial encounter 11/11/2022   Cervical spine arthritis 08/22/2020   History of anxiety 08/22/2020   History of gastroesophageal reflux (GERD) 08/22/2020   Essential hypertension 08/22/2020   History of hyperlipidemia 08/22/2020    PCP: [name] MD   REFERRING PROVIDER: Vanetta Mulders, MD  REFERRING DIAG:  [phone] (ICD-10-CM) - Labral tear of shoulder, right, initial encounter    THERAPY DIAG:  Stiffness of right shoulder, not elsewhere classified  Localized edema  Muscle weakness (generalized)  Acute pain of right  shoulder  Rationale for Evaluation and Treatment: Rehabilitation  ONSET DATE: 10/15/2022  SUBJECTIVE:                                                                                                                                                                                      SUBJECTIVE STATEMENT: Feeling like a 9/10 great.    PERTINENT HISTORY: 52 y.o. male right-hand-dominant male presents with right shoulder pain and weakness now ongoing for 6 months.  At this time he has  tried activity restriction and activity modification.  He has done physical therapy for strengthening of the shoulder.  He is trialed anti-inflammatories.  I did discuss that his exam is consistent with a labral tear with an intra-articular loose body.  Given the fact that there is a loose body we discussed the role for arthroscopic intervention and debridement with labral repair as needed.  I discussed the rehab associated with this.  I did also discuss an additional option would be to try steroid injection particularly as he has an upcoming trip to [place] for which she will be doing significant golfing.  I advised that this is also a possibility.  He would like to consider his options and he will call us back to let us know  PAIN:  Are you having pain? Yes: NPRS scale: 1/10 at rest  Pain location: R shoulder  Pain description: dull   Aggravating factors: jolts/bumping into stuff  Relieving factors: iceman, medicine  PRECAUTIONS: Other: NWB R UE, superior and inferior labral repair; PROM to tolerance per MD    WEIGHT BEARING RESTRICTIONS: Yes NWB RUE   FALLS:  Has patient fallen in last 6 months? 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 Sammuel Hines January 30th or 31st   OBJECTIVE:   DIAGNOSTIC FINDINGS:  IMPRESSION: 1. Rotator cuff tendinosis without tear. 2. Tear of the posterosuperior labrum. A multilobulated paralabral cyst extends along the posterior aspect of the glenohumeral joint extending towards the quadrilateral space measuring approximately 3.3 x 0.9 x 1.1 cm. 3. Mild degenerative changes of the [facility] joint with trace AC joint effusion. Mild pericapsular edema may be reactive or posttraumatic. 4. Trace subacromial-subdeltoid bursitis.  PROCEDURE: 1. Right shoulder superior labral repair 2. Right shoulder inferior labral repair 3. Right shoulder limited debridement  POSTOPERATIVE PLAN: He will be nonweight bearing on the right arm pending physical therapy. He will be mobilization and passive range of motion. He will be placed on aspirin for blood clot prevention  COGNITION: Overall cognitive status: Within functional limits for tasks assessed     SENSATION: WFL  POSTURE: 3/20: mild scapular winging bil, ribcage flare   UPPER EXTREMITY ROM:   Passive ROM Right eval RIght 3/20  Shoulder flexion 70* 130  Shoulder extension    Shoulder abduction 25* 130  Shoulder adduction    Shoulder internal rotation    Shoulder external rotation -45* limited by pain    Elbow flexion    Elbow extension    Wrist flexion    Wrist extension    Wrist ulnar deviation    Wrist radial deviation    Wrist pronation    Wrist supination    (Blank rows = not tested)  UPPER EXTREMITY MMT:  MMT (lb) Right 3/20 Left 3/20  Shoulder flexion 18.4 33.8  Shoulder extension 31.5 44.7  Shoulder abduction 9.9 31.5  Shoulder adduction    Shoulder internal rotation 17.5 23.8  Shoulder external rotation 12.4 23.2  Middle trapezius    Lower trapezius    Elbow flexion     Elbow extension    (Blank rows = not tested)  DNT due to new surgery, restrictions of protocol     TODAY'S TREATMENT:                                                                                                                                         DATE:   Treatment                            3/20:  MMT & goal review Supine diagonal resist Supine rythmic stabs at 90 Supine ab set- added GHJ flexion Seated & standing ab set with GHJ flexion   3/13 Wand flexion x20 2 lbs Pulley flexion 2 min   SL ER 2x10, 1x15 2lbs   Standing forward flexion 2x10 2lbs  Standing scaption 2x10 2lbs   Row at cable column 1x10 15lbs, 2x10 20lbs  IR behind back with wand, 5" x10 Extension behind back with wand x10  PROM into all planes   3/11 Wand flexion x20 2 lbs Pulley flexion 2 min   SL ER 2x15 2lbs   Chop 2x10   Standing forward flexion 2x10 2lbs  Standing scaption 2x10 2lbs Standing abduction 0lbs 2x10   Row machine 2x15  Shoulder extension 2x15 10 lbs   PROM into flexion and ER: PROM into all planes; Grade II and III PA and AP mobilization     PATIENT EDUCATION: Education details: intervention purpose  Person educated: Patient and Spouse Education method: Explanation, Demonstration, and Handouts 3Education comprehension: verbalized understanding and returned demonstration  HOME EXERCISE PROGRAM: Access Code: [email or web address] URL: [email or web address]   ASSESSMENT:  CLINICAL IMPRESSION: Pt has made significant improvements with progression through post op protocol. At this time, strength measures are as expected and will continue to progress as appropriate. Notable rib cage flare with compensation patterns through thoraco-lumbar extension above 120 deg. Increased strength demo with proper core contraction. Extended POC today and will continue on a weekly basis as he is doing HEP daily.   OBJECTIVE IMPAIRMENTS: decreased knowledge of condition, decreased  ROM, decreased strength, hypomobility, increased edema, increased fascial restrictions, increased muscle spasms, impaired flexibility, impaired UE functional use, and pain.   ACTIVITY LIMITATIONS: carrying, lifting, bathing, toileting, dressing, self feeding, reach over head, and hygiene/grooming  PARTICIPATION LIMITATIONS: meal prep, cleaning, laundry, medication management, interpersonal relationship, driving, shopping, community activity, and occupation  PERSONAL FACTORS: Age, Behavior pattern, Education, Fitness, and Past/current experiences are also affecting patient's functional outcome.   REHAB POTENTIAL: Excellent  CLINICAL DECISION MAKING: Stable/uncomplicated  EVALUATION COMPLEXITY: Low   GOALS: Goals reviewed with patient? Yes  SHORT TERM GOALS: Target date: 12/12/2022    Will be compliant with appropriate progressive HEP Baseline: Goal status: MET 12/15/22  2.  PROM and AAROM of surgical shoulder to be at least 150* flexion and abduction, PROM and AAROM ER/IR to be at least 45* without increase in pain  Baseline:  Goal status:achieved  3.  Pain in surgical shoulder to be no more than 4/10 at worst  Baseline:  Goal status: achieved  4.  Will be able to perform computer tasks on a Mod(I) basis with ergonomic and supportive adjustments PRN and no increase in pain  Baseline:  Goal status: achieved   LONG TERM GOALS: Target date: POC date  MMT surgical UE and periscap musculature  to be at least 4/5  Baseline:  Goal status: INITIAL  2.  Surgical UE AROM flexion and abduction to be full, ER/IR AROM to be at least 60 degrees  Baseline:  Goal status: IN PROGRESS  3.  Will be able to perform all dressing/bathing/self care tasks on an independent basis  Baseline:  Goal status: IN PROGRESS  4.  Pain to be no more than 2/10 at worst Baseline:  Goal status: INITIAL  5.  Will tolerate light strengthening activities within protocol to assist in prep for return to  sports specific tasks  Baseline:  Goal status: INITIAL  PLAN:  PT FREQUENCY: Weekly  PT DURATION:end of May  PLANNED INTERVENTIONS: Therapeutic exercises, Therapeutic activity, Neuromuscular re-education, Balance training, Gait training, Patient/Family education, Self Care, Joint mobilization, Aquatic Therapy, Dry Needling, Electrical stimulation, Cryotherapy, Moist heat, Taping, Ultrasound, Ionotophoresis 4mg /ml Dexamethasone, Manual therapy, and Re-evaluation  PLAN FOR NEXT SESSION: per protocol    Byrl Latin C. Yani Coventry PT, DPT 01/14/23 12:03 PM

## 2023-01-15 ENCOUNTER — Ambulatory Visit (HOSPITAL_BASED_OUTPATIENT_CLINIC_OR_DEPARTMENT_OTHER): Payer: No Typology Code available for payment source | Admitting: Physical Therapy

## 2023-01-19 ENCOUNTER — Ambulatory Visit (HOSPITAL_BASED_OUTPATIENT_CLINIC_OR_DEPARTMENT_OTHER): Payer: No Typology Code available for payment source | Admitting: Physical Therapy

## 2023-01-19 DIAGNOSIS — M25511 Pain in right shoulder: Secondary | ICD-10-CM

## 2023-01-19 DIAGNOSIS — M25611 Stiffness of right shoulder, not elsewhere classified: Secondary | ICD-10-CM

## 2023-01-19 DIAGNOSIS — R600 Localized edema: Secondary | ICD-10-CM

## 2023-01-19 DIAGNOSIS — M6281 Muscle weakness (generalized): Secondary | ICD-10-CM

## 2023-01-19 NOTE — Therapy (Unsigned)
OUTPATIENT PHYSICAL THERAPY SHOULDER TREATMENT    Patient Name: Andrew Cunningham MRN: [id number] DOB:12-11-1970, 52 y.o., male Today's Date: 01/20/2023  END OF SESSION:  PT End of Session - 01/20/23 0825     Visit Number 17    Number of Visits 21    Date for PT Re-Evaluation 03/27/23    Authorization Type [employer]    PT Start Time 1315    PT Stop Time [id number]    PT Time Calculation (min) 43 min    Activity Tolerance Patient tolerated treatment well    Behavior During Therapy WFL for tasks assessed/performed                   Past Medical History:  Diagnosis Date   Anxiety    per patient    Cervical spine arthritis    COVID-19    ED (erectile dysfunction)    GERD (gastroesophageal reflux disease)    per patient    Hyperlipemia    per patient    Hypertension    per patient    PONV (postoperative nausea and vomiting)    Situational stress    Past Surgical History:  Procedure Laterality Date   APPENDECTOMY  2005   [facility] ARTHROSCOPY WITH LABRAL REPAIR Right 11/11/2022   Procedure: RIGHT SHOULDER ARTHROSCOPY WITH LABRAL REPAIR AND LOOSE BODY REMOVAL;  Surgeon: Vanetta Mulders, MD;  Location: [facility];  Service: Orthopedics;  Laterality: Right;   Patient Active Problem List   Diagnosis Date Noted   Labral tear of shoulder, right, initial encounter 11/11/2022   Cervical spine arthritis 08/22/2020   History of anxiety 08/22/2020   History of gastroesophageal reflux (GERD) 08/22/2020   Essential hypertension 08/22/2020   History of hyperlipidemia 08/22/2020    PCP: [name] MD   REFERRING PROVIDER: Vanetta Mulders, MD  REFERRING DIAG:  [phone] (ICD-10-CM) - Labral tear of shoulder, right, initial encounter    THERAPY DIAG:  Stiffness of right shoulder, not elsewhere classified  Localized edema  Muscle weakness (generalized)  Acute pain of right shoulder  Rationale for Evaluation and Treatment:  Rehabilitation  ONSET DATE: 10/15/2022  SUBJECTIVE:                                                                                                                                                                                      SUBJECTIVE STATEMENT: Patient reports minor pain in the beginning of the session. He states that he is following his HEP program and it is going well.    PERTINENT HISTORY: 52 y.o. male right-hand-dominant male presents with right shoulder pain and weakness now  ongoing for 6 months.  At this time he has tried activity restriction and activity modification.  He has done physical therapy for strengthening of the shoulder.  He is trialed anti-inflammatories.  I did discuss that his exam is consistent with a labral tear with an intra-articular loose body.  Given the fact that there is a loose body we discussed the role for arthroscopic intervention and debridement with labral repair as needed.  I discussed the rehab associated with this.  I did also discuss an additional option would be to try steroid injection particularly as he has an upcoming trip to [place] for which she will be doing significant golfing.  I advised that this is also a possibility.  He would like to consider his options and he will call us back to let us know  PAIN:  Are you having pain? Yes: NPRS scale: 1/10 at rest  Pain location: R shoulder  Pain description: dull   Aggravating factors: jolts/bumping into stuff  Relieving factors: iceman, medicine  PRECAUTIONS: Other: NWB R UE, superior and inferior labral repair; PROM to tolerance per MD    WEIGHT BEARING RESTRICTIONS: Yes NWB RUE   FALLS:  Has patient fallen in last 6 months? 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 Sammuel Hines January 30th or 31st   OBJECTIVE:   DIAGNOSTIC FINDINGS:  IMPRESSION: 1. Rotator cuff tendinosis without tear. 2. Tear of the posterosuperior labrum. A multilobulated paralabral cyst extends along the posterior aspect of the glenohumeral joint extending towards the quadrilateral space measuring approximately 3.3 x 0.9 x 1.1 cm. 3. Mild degenerative changes of the [facility] joint with trace AC joint effusion. Mild pericapsular edema may be reactive or posttraumatic. 4. Trace subacromial-subdeltoid bursitis.  PROCEDURE: 1. Right shoulder superior labral repair 2. Right shoulder inferior labral repair 3. Right shoulder limited debridement  POSTOPERATIVE PLAN: He will be nonweight bearing on the right arm pending physical therapy. He will be mobilization and passive range of motion. He will be placed on aspirin for blood clot prevention  COGNITION: Overall cognitive status: Within functional limits for tasks assessed     SENSATION: WFL  POSTURE: 3/20: mild scapular winging bil, ribcage flare   UPPER EXTREMITY ROM:   Passive ROM Right eval RIght 3/20  Shoulder flexion 70* 130  Shoulder extension    Shoulder abduction 25* 130  Shoulder adduction    Shoulder internal rotation    Shoulder external rotation -45* limited by pain    Elbow flexion    Elbow extension    Wrist flexion    Wrist extension    Wrist ulnar deviation    Wrist radial deviation    Wrist pronation    Wrist supination    (Blank rows = not tested)  UPPER EXTREMITY MMT:  MMT (lb) Right 3/20 Left 3/20  Shoulder flexion 18.4 33.8  Shoulder extension 31.5 44.7  Shoulder abduction 9.9 31.5  Shoulder adduction    Shoulder internal rotation 17.5 23.8  Shoulder external rotation 12.4 23.2  Middle  trapezius    Lower trapezius    Elbow flexion  Elbow extension    (Blank rows = not tested)  DNT due to new surgery, restrictions of protocol     TODAY'S TREATMENT:                                                                                                                                         DATE:  3/26 Wand flexion x20 2 lbs Pulley flexion 2 min   SL ER 3x10 2lbs   T-band ER 2x15 red  T-band IR 2x15 red   Standing forward flexion 2x10 2lbs  Standing scaption 2x10 2lbs Standing abduction 0lbs 2x10   Cable Row machine 3x15 15lbs  Cable Shoulder extension 3x15 10 lbs   PROM into all planes     Treatment                            3/20:  MMT & goal review Supine diagonal resist Supine rythmic stabs at 90 Supine ab set- added GHJ flexion Seated & standing ab set with GHJ flexion   3/13 Wand flexion x20 2 lbs Pulley flexion 2 min   SL ER 2x10, 1x15 2lbs   Standing forward flexion 2x10 2lbs  Standing scaption 2x10 2lbs   Row at cable column 1x10 15lbs, 2x10 20lbs  IR behind back with wand, 5" x10 Extension behind back with wand x10  PROM into all planes   3/11 Wand flexion x20 2 lbs Pulley flexion 2 min   SL ER 2x15 2lbs   Chop 2x10   Standing forward flexion 2x10 2lbs  Standing scaption 2x10 2lbs Standing abduction 0lbs 2x10   Row machine 2x15  Shoulder extension 2x15 10 lbs   PROM into flexion and ER: PROM into all planes; Grade II and III PA and AP mobilization     PATIENT EDUCATION: Education details: intervention purpose  Person educated: Patient and Spouse Education method: Explanation, Demonstration, and Handouts 3Education comprehension: verbalized understanding and returned demonstration  HOME EXERCISE PROGRAM: Access Code: [email or web address] URL: [email or web address]   ASSESSMENT:  CLINICAL IMPRESSION: The patient continues to progress well. We added in t-band rotational strengthening. We continue to progress  him back into a light gym program. We have not started sport specific exercises. We will proceed per MD recommendation.  OBJECTIVE IMPAIRMENTS: decreased knowledge of condition, decreased ROM, decreased strength, hypomobility, increased edema, increased fascial restrictions, increased muscle spasms, impaired flexibility, impaired UE functional use, and pain.   ACTIVITY LIMITATIONS: carrying, lifting, bathing, toileting, dressing, self feeding, reach over head, and hygiene/grooming  PARTICIPATION LIMITATIONS: meal prep, cleaning, laundry, medication management, interpersonal relationship, driving, shopping, community activity, and occupation  PERSONAL FACTORS: Age, Behavior pattern, Education, Fitness, and Past/current experiences are also affecting patient's functional outcome.   REHAB POTENTIAL: Excellent  CLINICAL DECISION MAKING: Stable/uncomplicated  EVALUATION COMPLEXITY: Low   GOALS: Goals reviewed with patient? Yes  SHORT TERM GOALS: Target  date: 12/12/2022    Will be compliant with appropriate progressive HEP Baseline: Goal status: MET 12/15/22  2.  PROM and AAROM of surgical shoulder to be at least 150* flexion and abduction, PROM and AAROM ER/IR to be at least 45* without increase in pain  Baseline:  Goal status:achieved  3.  Pain in surgical shoulder to be no more than 4/10 at worst  Baseline:  Goal status: achieved  4.  Will be able to perform computer tasks on a Mod(I) basis with ergonomic and supportive adjustments PRN and no increase in pain  Baseline:  Goal status: achieved   LONG TERM GOALS: Target date: POC date  MMT surgical UE and periscap musculature  to be at least 4/5  Baseline:  Goal status: INITIAL  2.  Surgical UE AROM flexion and abduction to be full, ER/IR AROM to be at least 60 degrees  Baseline:  Goal status: IN PROGRESS  3.  Will be able to perform all dressing/bathing/self care tasks on an independent basis  Baseline:  Goal status: IN  PROGRESS  4.  Pain to be no more than 2/10 at worst Baseline:  Goal status: INITIAL  5.  Will tolerate light strengthening activities within protocol to assist in prep for return to sports specific tasks  Baseline:  Goal status: INITIAL  PLAN:  PT FREQUENCY: Weekly  PT DURATION:end of May  PLANNED INTERVENTIONS: Therapeutic exercises, Therapeutic activity, Neuromuscular re-education, Balance training, Gait training, Patient/Family education, Self Care, Joint mobilization, Aquatic Therapy, Dry Needling, Electrical stimulation, Cryotherapy, Moist heat, Taping, Ultrasound, Ionotophoresis 4mg /ml Dexamethasone, Manual therapy, and Re-evaluation  PLAN FOR NEXT SESSION: per protocol    Jessica C. Hightower PT, DPT 01/20/23 8:29 AM

## 2023-01-20 ENCOUNTER — Encounter (HOSPITAL_BASED_OUTPATIENT_CLINIC_OR_DEPARTMENT_OTHER): Payer: Self-pay | Admitting: Physical Therapy

## 2023-01-28 ENCOUNTER — Encounter (HOSPITAL_BASED_OUTPATIENT_CLINIC_OR_DEPARTMENT_OTHER): Payer: Self-pay | Admitting: Orthopaedic Surgery

## 2023-01-28 ENCOUNTER — Ambulatory Visit (INDEPENDENT_AMBULATORY_CARE_PROVIDER_SITE_OTHER): Payer: No Typology Code available for payment source | Admitting: Orthopaedic Surgery

## 2023-01-28 DIAGNOSIS — S43431A Superior glenoid labrum lesion of right shoulder, initial encounter: Secondary | ICD-10-CM

## 2023-01-28 NOTE — Progress Notes (Signed)
Post Operative Evaluation    Procedure/Date of Surgery: Right shoulder labral repair 1/16  Interval History:    Presents today for follow-up of his right shoulder.  Overall he is continuing to make significant improvement.  At this time he is having more days than where he does not have any pain.  He does occasionally have soreness or ache in the shoulder although this is continuing to improve.  PMH/PSH/Family History/Social History/Meds/Allergies:    Past Medical History:  Diagnosis Date   Anxiety    per patient    Cervical spine arthritis    COVID-19    ED (erectile dysfunction)    GERD (gastroesophageal reflux disease)    per patient    Hyperlipemia    per patient    Hypertension    per patient    PONV (postoperative nausea and vomiting)    Situational stress    Past Surgical History:  Procedure Laterality Date   APPENDECTOMY  2005   [place]   SHOULDER ARTHROSCOPY WITH LABRAL REPAIR Right 11/11/2022   Procedure: RIGHT SHOULDER ARTHROSCOPY WITH LABRAL REPAIR AND LOOSE BODY REMOVAL;  Surgeon: Vanetta Mulders, MD;  Location: [facility];  Service: Orthopedics;  Laterality: Right;   Social History   Socioeconomic History   Marital status: Married    Spouse name: Not on file   Number of children: Not on file   Years of education: Not on file   Highest education level: Not on file  Occupational History   Not on file  Tobacco Use   Smoking status: Former   Smokeless tobacco: Former    Types: Nurse, children's Use: Never used  Substance and Sexual Activity   Alcohol use: Yes    Comment: 2 drinks daily    Drug use: Not Currently   Sexual activity: Not on file  Other Topics Concern   Not on file  Social History Narrative   Not on file   Social Determinants of Health   Financial Resource Strain: Not on file  Food Insecurity: Not on file  Transportation Needs: Not on file  Physical Activity:  Not on file  Stress: Not on file  Social Connections: Not on file   Family History  Problem Relation Age of Onset   Healthy Mother    Arthritis Mother    Healthy Father    Healthy Sister    Healthy Daughter    No Known Allergies Current Outpatient Medications  Medication Sig Dispense Refill   ALPRAZolam (XANAX) 0.5 MG tablet Take 0.5 mg by mouth daily as needed.     aspirin EC 325 MG tablet Take 1 tablet (325 mg total) by mouth daily. 30 tablet 0   calcium-vitamin D (OSCAL WITH D) 500-5 MG-MCG tablet Take 1 tablet by mouth.     cetirizine (ZYRTEC) 10 MG tablet Take 10 mg by mouth daily.     influenza vac split quadrivalent PF (FLUARIX) 0.5 ML injection Inject into the muscle. 0.5 mL 0   losartan (COZAAR) 50 MG tablet Take 50 mg by mouth daily.     omeprazole (PRILOSEC) 10 MG capsule Take 10 mg by mouth daily.     oxyCODONE (ROXICODONE) 5 MG immediate release tablet Take 1 tablet (5 mg total) by mouth every 4 (four) hours  as needed for severe pain or breakthrough pain. 30 tablet 0   propranolol (INDERAL) 20 MG tablet Take 20 mg by mouth as needed.     rosuvastatin (CRESTOR) 5 MG tablet Take 5 mg by mouth daily.     vitamin B-12 (CYANOCOBALAMIN) 100 MCG tablet Take 100 mcg by mouth daily.     No current facility-administered medications for this visit.   No results found.  Review of Systems:   A ROS was performed including pertinent positives and negatives as documented in the HPI.   Musculoskeletal Exam:     Right shoulder incisions are healed.  In the sitting position he is able to forward elevate to 160 degrees with external rotation at the side to 50 degrees.  Internal rotation is to L5.  Distal neurosensory exam is intact  Imaging:      I personally reviewed and interpreted the radiographs.   Assessment:   Near 79-month status post right shoulder arthroscopic labral repair overall doing extremely well.  At this time he will go back to golfing.  I did describe that I  would like him to start with chipping and putting for the course of the next month and then he may progress to irons and driver following that.  All limitations and restrictions were discussed.  I will plan to see him back in 3 months for final check  Plan :    -Return to clinic in 3 months for final check      I personally saw and evaluated the patient, and participated in the management and treatment plan.  Vanetta Mulders, MD Attending Physician, Orthopedic Surgery  This document was dictated using Dragon voice recognition software. A reasonable attempt at proof reading has been made to minimize errors.

## 2023-02-02 ENCOUNTER — Ambulatory Visit (HOSPITAL_BASED_OUTPATIENT_CLINIC_OR_DEPARTMENT_OTHER): Payer: No Typology Code available for payment source | Attending: Orthopaedic Surgery | Admitting: Physical Therapy

## 2023-02-02 ENCOUNTER — Encounter (HOSPITAL_BASED_OUTPATIENT_CLINIC_OR_DEPARTMENT_OTHER): Payer: Self-pay | Admitting: Physical Therapy

## 2023-02-02 DIAGNOSIS — R600 Localized edema: Secondary | ICD-10-CM | POA: Diagnosis present

## 2023-02-02 DIAGNOSIS — M25611 Stiffness of right shoulder, not elsewhere classified: Secondary | ICD-10-CM | POA: Diagnosis present

## 2023-02-02 DIAGNOSIS — M6281 Muscle weakness (generalized): Secondary | ICD-10-CM | POA: Insufficient documentation

## 2023-02-02 DIAGNOSIS — M25511 Pain in right shoulder: Secondary | ICD-10-CM | POA: Insufficient documentation

## 2023-02-02 NOTE — Therapy (Signed)
OUTPATIENT PHYSICAL THERAPY SHOULDER TREATMENT    Patient Name: Andrew BruceDavid R Grau MRN: [id number] DOB:Sep 04, 1971, 52 y.o., male Today's Date: 02/02/2023  END OF SESSION:  PT End of Session - 02/02/23 1003     Visit Number 18    Number of Visits 21    Date for PT Re-Evaluation 03/27/23    Authorization Type [employer]et[employer]    PT Start Time [phone]    PT Stop Time 1011    PT Time Calculation (min) 40 min    Activity Tolerance Patient tolerated treatment well    Behavior During Therapy WFL for tasks assessed/performed                    Past Medical History:  Diagnosis Date   Anxiety    per patient    Cervical spine arthritis    COVID-19    ED (erectile dysfunction)    GERD (gastroesophageal reflux disease)    per patient    Hyperlipemia    per patient    Hypertension    per patient    PONV (postoperative nausea and vomiting)    Situational stress    Past Surgical History:  Procedure Laterality Date   APPENDECTOMY  2005   HERNIA REPAIR  1992   SHOULDER ARTHROSCOPY WITH LABRAL REPAIR Right 11/11/2022   Procedure: RIGHT SHOULDER ARTHROSCOPY WITH LABRAL REPAIR AND LOOSE BODY REMOVAL;  Surgeon: Huel CoteBokshan, Steven, MD;  Location: [facility];  Service: Orthopedics;  Laterality: Right;   Patient Active Problem List   Diagnosis Date Noted   Labral tear of shoulder, right, initial encounter 11/11/2022   Cervical spine arthritis 08/22/2020   History of anxiety 08/22/2020   History of gastroesophageal reflux (GERD) 08/22/2020   Essential hypertension 08/22/2020   History of hyperlipidemia 08/22/2020    PCP: Tally JoeSwayne, Khaleef MD   REFERRING PROVIDER: Huel CoteBokshan, Steven, MD  REFERRING DIAG:  [phone] (ICD-10-CM) - Labral tear of shoulder, right, initial encounter    THERAPY DIAG:  Stiffness of right shoulder, not elsewhere classified  Muscle weakness (generalized)  Acute pain of right shoulder  Rationale for Evaluation and Treatment: Rehabilitation  ONSET DATE:  10/15/2022  SUBJECTIVE:                                                                                                                                                                                      SUBJECTIVE STATEMENT: Patient reports no significant pain. He states that he is following his HEP program and it is going well. Patient is having no pain. Patient states that he will start swinging his golf club later this week.    PERTINENT HISTORY: 52 y.o.  male right-hand-dominant male presents with right shoulder pain and weakness now ongoing for 6 months.  At this time he has tried activity restriction and activity modification.  He has done physical therapy for strengthening of the shoulder.  He is trialed anti-inflammatories.  I did discuss that his exam is consistent with a labral tear with an intra-articular loose body.  Given the fact that there is a loose body we discussed the role for arthroscopic intervention and debridement with labral repair as needed.  I discussed the rehab associated with this.  I did also discuss an additional option would be to try steroid injection particularly as he has an upcoming trip to [place] for which she will be doing significant golfing.  I advised that this is also a possibility.  He would like to consider his options and he will call us back to let us know  PAIN:  Are you having pain? Yes: NPRS scale: 1/10 at rest  Pain location: R shoulder  Pain description: dull   Aggravating factors: jolts/bumping into stuff  Relieving factors: iceman, medicine  PRECAUTIONS: Other: NWB R UE, superior and inferior labral repair; PROM to tolerance per MD    WEIGHT BEARING RESTRICTIONS: Yes NWB RUE   FALLS:  Has patient fallen in last 6 months? 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 Steward Drone January 30th or 31st   OBJECTIVE:   DIAGNOSTIC FINDINGS:  IMPRESSION: 1. Rotator cuff tendinosis without tear. 2. Tear of the posterosuperior labrum. A multilobulated paralabral cyst extends along the posterior aspect of the glenohumeral joint extending towards the quadrilateral space measuring approximately 3.3 x 0.9 x 1.1 cm. 3. Mild degenerative changes of the [facility] joint with trace AC joint effusion. Mild pericapsular edema may be reactive or posttraumatic. 4. Trace subacromial-subdeltoid bursitis.  PROCEDURE: 1. Right shoulder superior labral repair 2. Right shoulder inferior labral repair 3. Right shoulder limited debridement  POSTOPERATIVE PLAN: He will be nonweight bearing on the right arm pending physical therapy. He will be mobilization and passive range of motion. He will be placed on aspirin for blood clot prevention  COGNITION: Overall cognitive status: Within functional limits for tasks assessed     SENSATION: WFL  POSTURE: 3/20: mild scapular winging bil, ribcage flare   UPPER EXTREMITY ROM:   Passive ROM Right eval RIght 3/20  Shoulder flexion 70* 130  Shoulder extension    Shoulder abduction 25* 130  Shoulder adduction    Shoulder internal rotation    Shoulder external rotation -45* limited by pain    Elbow flexion    Elbow extension    Wrist flexion    Wrist extension    Wrist ulnar deviation    Wrist radial deviation    Wrist pronation    Wrist supination    (Blank rows = not tested)  UPPER EXTREMITY MMT:  MMT (lb) Right 3/20 Left 3/20  Shoulder flexion 18.4 33.8  Shoulder extension 31.5 44.7  Shoulder abduction 9.9 31.5  Shoulder adduction    Shoulder internal rotation 17.5 23.8   Shoulder external rotation 12.4 23.2  Middle trapezius  Lower trapezius    Elbow flexion    Elbow extension    (Blank rows = not tested)  DNT due to new surgery, restrictions of protocol     TODAY'S TREATMENT:                                                                                                                                         DATE:  4/8 Wand flexion 3x10 3lbs SL ER 3x10  Body blade x30 sec horizontal abduction Body blade x30 sec  Chops 2x10 Cable golf swing bilateral 2x10 10lbs   Beginning serve fwd and back 2x10 yellow  Back hand yellow 2x10     3/26 Wand flexion x20 2 lbs Pulley flexion 2 min   SL ER 3x10 2lbs   T-band ER 2x15 red  T-band IR 2x15 red   Standing forward flexion 2x10 2lbs  Standing scaption 2x10 2lbs Standing abduction 0lbs 2x10   Cable Row machine 3x15 15lbs  Cable Shoulder extension 3x15 10 lbs   PROM into all planes     Treatment                            3/20:  MMT & goal review Supine diagonal resist Supine rythmic stabs at 90 Supine ab set- added GHJ flexion Seated & standing ab set with GHJ flexion   3/13 Wand flexion x20 2 lbs Pulley flexion 2 min   SL ER 2x10, 1x15 2lbs   Standing forward flexion 2x10 2lbs  Standing scaption 2x10 2lbs   Row at cable column 1x10 15lbs, 2x10 20lbs  IR behind back with wand, 5" x10 Extension behind back with wand x10  PROM into all planes   3/11 Wand flexion x20 2 lbs Pulley flexion 2 min   SL ER 2x15 2lbs   Chop 2x10   Standing forward flexion 2x10 2lbs  Standing scaption 2x10 2lbs Standing abduction 0lbs 2x10   Row machine 2x15  Shoulder extension 2x15 10 lbs   PROM into flexion and ER: PROM into all planes; Grade II and III PA and AP mobilization     PATIENT EDUCATION: Education details: intervention purpose  Person educated: Patient and Spouse Education method: Explanation, Demonstration, and Handouts 3Education comprehension: verbalized  understanding and returned demonstration  HOME EXERCISE PROGRAM: Access Code: [email or web address] URL: [email or web address]   ASSESSMENT:  CLINICAL IMPRESSION: Therapy began sport specific training today. He began working on Holiday representative and back hand with a band. He had no increase in pain> We also added the body blade. He reported fatigue but no pain. The MD cleared him to start chipping and putting. He will do that for about a month then progress. We will assess tolerance to sports specific activity and progress as tolerated.   OBJECTIVE IMPAIRMENTS: decreased knowledge of condition, decreased ROM, decreased strength, hypomobility, increased edema, increased fascial  restrictions, increased muscle spasms, impaired flexibility, impaired UE functional use, and pain.   ACTIVITY LIMITATIONS: carrying, lifting, bathing, toileting, dressing, self feeding, reach over head, and hygiene/grooming  PARTICIPATION LIMITATIONS: meal prep, cleaning, laundry, medication management, interpersonal relationship, driving, shopping, community activity, and occupation  PERSONAL FACTORS: Age, Behavior pattern, Education, Fitness, and Past/current experiences are also affecting patient's functional outcome.   REHAB POTENTIAL: Excellent  CLINICAL DECISION MAKING: Stable/uncomplicated  EVALUATION COMPLEXITY: Low   GOALS: Goals reviewed with patient? Yes  SHORT TERM GOALS: Target date: 12/12/2022    Will be compliant with appropriate progressive HEP Baseline: Goal status: MET 12/15/22  2.  PROM and AAROM of surgical shoulder to be at least 150* flexion and abduction, PROM and AAROM ER/IR to be at least 45* without increase in pain  Baseline:  Goal status:achieved  3.  Pain in surgical shoulder to be no more than 4/10 at worst  Baseline:  Goal status: achieved  4.  Will be able to perform computer tasks on a Mod(I) basis with ergonomic and supportive adjustments PRN and no increase in  pain  Baseline:  Goal status: achieved   LONG TERM GOALS: Target date: POC date  MMT surgical UE and periscap musculature  to be at least 4/5  Baseline:  Goal status: INITIAL  2.  Surgical UE AROM flexion and abduction to be full, ER/IR AROM to be at least 60 degrees  Baseline:  Goal status: IN PROGRESS  3.  Will be able to perform all dressing/bathing/self care tasks on an independent basis  Baseline:  Goal status: IN PROGRESS  4.  Pain to be no more than 2/10 at worst Baseline:  Goal status: INITIAL  5.  Will tolerate light strengthening activities within protocol to assist in prep for return to sports specific tasks  Baseline:  Goal status: INITIAL  PLAN:  PT FREQUENCY: Weekly  PT DURATION:end of May  PLANNED INTERVENTIONS: Therapeutic exercises, Therapeutic activity, Neuromuscular re-education, Balance training, Gait training, Patient/Family education, Self Care, Joint mobilization, Aquatic Therapy, Dry Needling, Electrical stimulation, Cryotherapy, Moist heat, Taping, Ultrasound, Ionotophoresis 4mg /ml Dexamethasone, Manual therapy, and Re-evaluation  PLAN FOR NEXT SESSION: per protocol    Lorayne Bender PT DPT  02/02/23 12:09 PM  Cristal Ford SPT   During this treatment session, the therapist was present, participating in and directing the treatment.

## 2023-02-09 ENCOUNTER — Encounter (HOSPITAL_BASED_OUTPATIENT_CLINIC_OR_DEPARTMENT_OTHER): Payer: Self-pay

## 2023-02-09 ENCOUNTER — Ambulatory Visit (HOSPITAL_BASED_OUTPATIENT_CLINIC_OR_DEPARTMENT_OTHER): Payer: No Typology Code available for payment source

## 2023-02-09 DIAGNOSIS — M25511 Pain in right shoulder: Secondary | ICD-10-CM

## 2023-02-09 DIAGNOSIS — R600 Localized edema: Secondary | ICD-10-CM

## 2023-02-09 DIAGNOSIS — M25611 Stiffness of right shoulder, not elsewhere classified: Secondary | ICD-10-CM | POA: Diagnosis not present

## 2023-02-09 DIAGNOSIS — M6281 Muscle weakness (generalized): Secondary | ICD-10-CM

## 2023-02-09 NOTE — Therapy (Signed)
OUTPATIENT PHYSICAL THERAPY SHOULDER TREATMENT    Patient Name: Andrew Cunningham MRN: [id number] DOB:Apr 04, 1971, 52 y.o., male Today's Date: 02/09/2023  END OF SESSION:  PT End of Session - 02/09/23 1433     Visit Number 19    Number of Visits 21    Date for PT Re-Evaluation 03/27/23    Authorization Type Aetna    Authorization Time Period 11/14/22 to 01/23/23    PT Start Time 1435    PT Stop Time 1515    PT Time Calculation (min) 40 min    Activity Tolerance Patient tolerated treatment well    Behavior During Therapy WFL for tasks assessed/performed                     Past Medical History:  Diagnosis Date   Anxiety    per patient    Cervical spine arthritis    COVID-19    ED (erectile dysfunction)    GERD (gastroesophageal reflux disease)    per patient    Hyperlipemia    per patient    Hypertension    per patient    PONV (postoperative nausea and vomiting)    Situational stress    Past Surgical History:  Procedure Laterality Date   APPENDECTOMY  2005   HERNIA REPAIR  1992   SHOULDER ARTHROSCOPY WITH LABRAL REPAIR Right 11/11/2022   Procedure: RIGHT SHOULDER ARTHROSCOPY WITH LABRAL REPAIR AND LOOSE BODY REMOVAL;  Surgeon: Huel Cote, MD;  Location: [facility];  Service: Orthopedics;  Laterality: Right;   Patient Active Problem List   Diagnosis Date Noted   Labral tear of shoulder, right, initial encounter 11/11/2022   Cervical spine arthritis 08/22/2020   History of anxiety 08/22/2020   History of gastroesophageal reflux (GERD) 08/22/2020   Essential hypertension 08/22/2020   History of hyperlipidemia 08/22/2020    PCP: Tally Joe MD   REFERRING PROVIDER: Huel Cote, MD  REFERRING DIAG:  [phone] (ICD-10-CM) - Labral tear of shoulder, right, initial encounter    THERAPY DIAG:  Stiffness of right shoulder, not elsewhere classified  Muscle weakness (generalized)  Acute pain of right shoulder  Localized  edema  Rationale for Evaluation and Treatment: Rehabilitation  ONSET DATE: 10/15/2022  SUBJECTIVE:                                                                                                                                                                                      SUBJECTIVE STATEMENT: Pt reports he was able to swing a wedge club which did not bother him.    PERTINENT HISTORY:   PAIN:  Are you having pain? Yes: NPRS scale:  1/10 at rest  Pain location: R shoulder  Pain description: dull   Aggravating factors: jolts/bumping into stuff  Relieving factors: iceman, medicine  PRECAUTIONS: Other: NWB R UE, superior and inferior labral repair; PROM to tolerance per MD    WEIGHT BEARING RESTRICTIONS: Yes NWB RUE   FALLS:  Has patient fallen in last 6 months? 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 Steward Drone January 30th or 31st   OBJECTIVE:   DIAGNOSTIC FINDINGS:  IMPRESSION: 1. Rotator cuff tendinosis without tear. 2. Tear of the posterosuperior labrum. A multilobulated paralabral cyst extends along the posterior aspect of the glenohumeral joint extending towards the quadrilateral space measuring approximately 3.3 x 0.9 x 1.1 cm. 3. Mild degenerative changes of the [facility] joint with trace AC joint effusion. Mild pericapsular edema may be reactive or posttraumatic. 4. Trace subacromial-subdeltoid bursitis.  PROCEDURE: 1. Right shoulder superior labral repair 2. Right shoulder inferior labral repair 3. Right shoulder limited  debridement  POSTOPERATIVE PLAN: He will be nonweight bearing on the right arm pending physical therapy. He will be mobilization and passive range of motion. He will be placed on aspirin for blood clot prevention  COGNITION: Overall cognitive status: Within functional limits for tasks assessed     SENSATION: WFL  POSTURE: 3/20: mild scapular winging bil, ribcage flare   UPPER EXTREMITY ROM:   Passive ROM Right eval RIght 3/20  Shoulder flexion 70* 130  Shoulder extension    Shoulder abduction 25* 130  Shoulder adduction    Shoulder internal rotation    Shoulder external rotation -45* limited by pain    Elbow flexion    Elbow extension    Wrist flexion    Wrist extension    Wrist ulnar deviation    Wrist radial deviation    Wrist pronation    Wrist supination    (Blank rows = not tested)  UPPER EXTREMITY MMT:  MMT (lb) Right 3/20 Left 3/20  Shoulder flexion 18.4 33.8  Shoulder extension 31.5 44.7  Shoulder abduction 9.9 31.5  Shoulder adduction    Shoulder internal rotation 17.5 23.8  Shoulder external rotation 12.4 23.2  Middle trapezius    Lower trapezius    Elbow flexion    Elbow extension    (Blank rows = not tested)  DNT due to new surgery, restrictions of protocol     TODAY'S TREATMENT:  DATE:   4/15  PROM all planes Wall push ups 2x10 Standing flexion to 90 with contralateral hold at 90. 3# x10ea Standing abduction 2x10 3lbs Triceps machine (white) 25lbs 2x10 Lat pulls at cable column- 25lbs 2x10 Wall clocks RTB 2x5R ER at wall 90/90 x5 Bilateral ER RTB 2x10     4/8 Wand flexion 3x10 3lbs SL ER 3x10  Body blade x30 sec horizontal abduction Body blade x30 sec  Chops 2x10 Cable golf swing bilateral 2x10 10lbs   Beginning serve fwd and back 2x10 yellow  Back hand yellow 2x10    PATIENT  EDUCATION: Education details: intervention purpose  Person educated: Patient and Spouse Education method: Explanation, Demonstration, and Handouts 3Education comprehension: verbalized understanding and returned demonstration  HOME EXERCISE PROGRAM: Access Code: [email or web address] URL: [email or web address]   ASSESSMENT:  CLINICAL IMPRESSION: Continued to work on strength, endurance, and stability. Progressed with strengthening today with overall good tolerance, though some soreness was reported by end of session. Will monitor this as progress as able next visit.   OBJECTIVE IMPAIRMENTS: decreased knowledge of condition, decreased ROM, decreased strength, hypomobility, increased edema, increased fascial restrictions, increased muscle spasms, impaired flexibility, impaired UE functional use, and pain.   ACTIVITY LIMITATIONS: carrying, lifting, bathing, toileting, dressing, self feeding, reach over head, and hygiene/grooming  PARTICIPATION LIMITATIONS: meal prep, cleaning, laundry, medication management, interpersonal relationship, driving, shopping, community activity, and occupation  PERSONAL FACTORS: Age, Behavior pattern, Education, Fitness, and Past/current experiences are also affecting patient's functional outcome.   REHAB POTENTIAL: Excellent  CLINICAL DECISION MAKING: Stable/uncomplicated  EVALUATION COMPLEXITY: Low   GOALS: Goals reviewed with patient? Yes  SHORT TERM GOALS: Target date: 12/12/2022    Will be compliant with appropriate progressive HEP Baseline: Goal status: MET 12/15/22  2.  PROM and AAROM of surgical shoulder to be at least 150* flexion and abduction, PROM and AAROM ER/IR to be at least 45* without increase in pain  Baseline:  Goal status:achieved  3.  Pain in surgical shoulder to be no more than 4/10 at worst  Baseline:  Goal status: achieved  4.  Will be able to perform computer tasks on a Mod(I) basis with ergonomic and supportive  adjustments PRN and no increase in pain  Baseline:  Goal status: achieved   LONG TERM GOALS: Target date: POC date  MMT surgical UE and periscap musculature  to be at least 4/5  Baseline:  Goal status: INITIAL  2.  Surgical UE AROM flexion and abduction to be full, ER/IR AROM to be at least 60 degrees  Baseline:  Goal status: IN PROGRESS  3.  Will be able to perform all dressing/bathing/self care tasks on an independent basis  Baseline:  Goal status: IN PROGRESS  4.  Pain to be no more than 2/10 at worst Baseline:  Goal status: INITIAL  5.  Will tolerate light strengthening activities within protocol to assist in prep for return to sports specific tasks  Baseline:  Goal status: INITIAL  PLAN:  PT FREQUENCY: Weekly  PT DURATION:end of May  PLANNED INTERVENTIONS: Therapeutic exercises, Therapeutic activity, Neuromuscular re-education, Balance training, Gait training, Patient/Family education, Self Care, Joint mobilization, Aquatic Therapy, Dry Needling, Electrical stimulation, Cryotherapy, Moist heat, Taping, Ultrasound, Ionotophoresis /ml Dexamethasone, Manual therapy, and Re-evaluation  PLAN FOR NEXT SESSION: per protocol    Riki Altes, PTA  02/09/23 3:29 PM

## 2023-02-16 ENCOUNTER — Ambulatory Visit (HOSPITAL_BASED_OUTPATIENT_CLINIC_OR_DEPARTMENT_OTHER): Payer: No Typology Code available for payment source

## 2023-02-16 ENCOUNTER — Encounter (HOSPITAL_BASED_OUTPATIENT_CLINIC_OR_DEPARTMENT_OTHER): Payer: Self-pay

## 2023-02-16 DIAGNOSIS — M25511 Pain in right shoulder: Secondary | ICD-10-CM

## 2023-02-16 DIAGNOSIS — M6281 Muscle weakness (generalized): Secondary | ICD-10-CM

## 2023-02-16 DIAGNOSIS — M25611 Stiffness of right shoulder, not elsewhere classified: Secondary | ICD-10-CM | POA: Diagnosis not present

## 2023-02-16 DIAGNOSIS — R600 Localized edema: Secondary | ICD-10-CM

## 2023-02-16 NOTE — Therapy (Signed)
OUTPATIENT PHYSICAL THERAPY SHOULDER TREATMENT    Patient Name: Andrew Cunningham MRN: [id number] DOB:03-25-1971, 52 y.o., male Today's Date: 02/16/2023  END OF SESSION:  PT End of Session - 02/16/23 0900     Visit Number 20    Number of Visits 21    Date for PT Re-Evaluation 03/27/23    Authorization Type [employer]    Authorization Time Period 11/14/22 to 01/23/23    PT Start Time 0849    PT Stop Time 0928    PT Time Calculation (min) 39 min    Activity Tolerance Patient tolerated treatment well    Behavior During Therapy [facility] for tasks assessed/performed                      Past Medical History:  Diagnosis Date   Anxiety    per patient    Cervical spine arthritis    COVID-19    ED (erectile dysfunction)    GERD (gastroesophageal reflux disease)    per patient    Hyperlipemia    per patient    Hypertension    per patient    PONV (postoperative nausea and vomiting)    Situational stress    Past Surgical History:  Procedure Laterality Date   APPENDECTOMY  2005   HERNIA REPAIR  1992   SHOULDER ARTHROSCOPY WITH LABRAL REPAIR Right 11/11/2022   Procedure: RIGHT SHOULDER ARTHROSCOPY WITH LABRAL REPAIR AND LOOSE BODY REMOVAL;  Surgeon: Huel Cote, MD;  Location: [facility];  Service: Orthopedics;  Laterality: Right;   Patient Active Problem List   Diagnosis Date Noted   Labral tear of shoulder, right, initial encounter 11/11/2022   Cervical spine arthritis 08/22/2020   History of anxiety 08/22/2020   History of gastroesophageal reflux (GERD) 08/22/2020   Essential hypertension 08/22/2020   History of hyperlipidemia 08/22/2020    PCP: Tally Joe MD   REFERRING PROVIDER: Huel Cote, MD  REFERRING DIAG:  [phone] (ICD-10-CM) - Labral tear of shoulder, right, initial encounter    THERAPY DIAG:  Stiffness of right shoulder, not elsewhere classified  Muscle weakness (generalized)  Acute pain of right shoulder  Localized  edema  Rationale for Evaluation and Treatment: Rehabilitation  ONSET DATE: 10/15/2022  SUBJECTIVE:                                                                                                                                                                                      SUBJECTIVE STATEMENT: Pt reports mild soreness after last session. "I stretched before I came here today."   PERTINENT HISTORY:   PAIN:  Are you having pain? Yes: NPRS scale: 1/10  at rest  Pain location: R shoulder  Pain description: dull   Aggravating factors: jolts/bumping into stuff  Relieving factors: iceman, medicine  PRECAUTIONS: Other: NWB R UE, superior and inferior labral repair; PROM to tolerance per MD    WEIGHT BEARING RESTRICTIONS: Yes NWB RUE   FALLS:  Has patient fallen in last 6 months? 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 Steward Drone January 30th or 31st   OBJECTIVE:   DIAGNOSTIC FINDINGS:  IMPRESSION: 1. Rotator cuff tendinosis without tear. 2. Tear of the posterosuperior labrum. A multilobulated paralabral cyst extends along the posterior aspect of the glenohumeral joint extending towards the quadrilateral space measuring approximately 3.3 x 0.9 x 1.1 cm. 3. Mild degenerative changes of the [facility] joint with trace AC joint effusion. Mild pericapsular edema may be reactive or posttraumatic. 4. Trace subacromial-subdeltoid bursitis.  PROCEDURE: 1. Right shoulder superior labral repair 2. Right shoulder inferior labral repair 3. Right shoulder limited  debridement  POSTOPERATIVE PLAN: He will be nonweight bearing on the right arm pending physical therapy. He will be mobilization and passive range of motion. He will be placed on aspirin for blood clot prevention  COGNITION: Overall cognitive status: Within functional limits for tasks assessed     SENSATION: WFL  POSTURE: 3/20: mild scapular winging bil, ribcage flare   UPPER EXTREMITY ROM:   Passive ROM Right eval RIght 3/20  Shoulder flexion 70* 130  Shoulder extension    Shoulder abduction 25* 130  Shoulder adduction    Shoulder internal rotation    Shoulder external rotation -45* limited by pain    Elbow flexion    Elbow extension    Wrist flexion    Wrist extension    Wrist ulnar deviation    Wrist radial deviation    Wrist pronation    Wrist supination    (Blank rows = not tested)  UPPER EXTREMITY MMT:  MMT (lb) Right 3/20 Left 3/20  Shoulder flexion 18.4 33.8  Shoulder extension 31.5 44.7  Shoulder abduction 9.9 31.5  Shoulder adduction    Shoulder internal rotation 17.5 23.8  Shoulder external rotation 12.4 23.2  Middle trapezius    Lower trapezius    Elbow flexion    Elbow extension    (Blank rows = not tested)  DNT due to new surgery, restrictions of protocol     TODAY'S TREATMENT:  DATE:   4/22  PROM all planes Wall push ups 2x10 Standing flexion to 90 with contralateral hold at 90. 3# 2x10ea Standing abduction 2x10 3lbs Bicep curl to [facility] press 3# 2x10 IR at cable column-5lbs 2x10ER with GTB 2x10 Standing cbale row bilateral-25lb 2x10 Bilateral ER RTB 2x10 Prone flexion 2# x10 Prone ext 2# x10 Prone H abd (palm down and thumb up) x10ea 0# 90/90ER prone 2# x10  4/15  PROM all planes Wall push ups 2x10 Standing flexion to 90 with contralateral hold at 90. 3# x10ea Standing abduction 2x10  3lbs Triceps machine (white) 25lbs 2x10 Lat pulls at cable column- 25lbs 2x10 Wall clocks RTB 2x5R ER at wall 90/90 x5 Bilateral ER RTB 2x10     4/8 Wand flexion 3x10 3lbs SL ER 3x10  Body blade x30 sec horizontal abduction Body blade x30 sec  Chops 2x10 Cable golf swing bilateral 2x10 10lbs   Beginning serve fwd and back 2x10 yellow  Back hand yellow 2x10    PATIENT EDUCATION: Education details: intervention purpose  Person educated: Patient and Spouse Education method: Explanation, Demonstration, and Handouts 3Education comprehension: verbalized understanding and returned demonstration  HOME EXERCISE PROGRAM: Access Code: [email or web address] URL: [email or web address]   ASSESSMENT:  CLINICAL IMPRESSION: Excellent available PROM today, only mild restriction in IR passively. Able to gently progress with strengthening today. He was challenged with prone flexion and prone horizontal abduction both with palm down and thumb up. Will continue to work on scapular strengthening/stability for pts eventual return to pickleball.  OBJECTIVE IMPAIRMENTS: decreased knowledge of condition, decreased ROM, decreased strength, hypomobility, increased edema, increased fascial restrictions, increased muscle spasms, impaired flexibility, impaired UE functional use, and pain.   ACTIVITY LIMITATIONS: carrying, lifting, bathing, toileting, dressing, self feeding, reach over head, and hygiene/grooming  PARTICIPATION LIMITATIONS: meal prep, cleaning, laundry, medication management, interpersonal relationship, driving, shopping, community activity, and occupation  PERSONAL FACTORS: Age, Behavior pattern, Education, Fitness, and Past/current experiences are also affecting patient's functional outcome.   REHAB POTENTIAL: Excellent  CLINICAL DECISION MAKING: Stable/uncomplicated  EVALUATION COMPLEXITY: Low   GOALS: Goals reviewed with patient? Yes  SHORT TERM GOALS: Target date:  12/12/2022    Will be compliant with appropriate progressive HEP Baseline: Goal status: MET 12/15/22  2.  PROM and AAROM of surgical shoulder to be at least 150* flexion and abduction, PROM and AAROM ER/IR to be at least 45* without increase in pain  Baseline:  Goal status:achieved  3.  Pain in surgical shoulder to be no more than 4/10 at worst  Baseline:  Goal status: achieved  4.  Will be able to perform computer tasks on a Mod(I) basis with ergonomic and supportive adjustments PRN and no increase in pain  Baseline:  Goal status: achieved   LONG TERM GOALS: Target date: POC date  MMT surgical UE and periscap musculature  to be at least 4/5  Baseline:  Goal status: INITIAL  2.  Surgical UE AROM flexion and abduction to be full, ER/IR AROM to be at least 60 degrees  Baseline:  Goal status: IN PROGRESS  3.  Will be able to perform all dressing/bathing/self care tasks on an independent basis  Baseline:  Goal status: IN PROGRESS  4.  Pain to be no more than 2/10 at worst Baseline:  Goal status: INITIAL  5.  Will tolerate light strengthening activities within protocol to assist in prep for return to sports specific tasks  Baseline:  Goal status: IN PROGRESS  PLAN:  PT FREQUENCY: Weekly  PT  DURATION:end of May  PLANNED INTERVENTIONS: Therapeutic exercises, Therapeutic activity, Neuromuscular re-education, Balance training, Gait training, Patient/Family education, Self Care, Joint mobilization, Aquatic Therapy, Dry Needling, Electrical stimulation, Cryotherapy, Moist heat, Taping, Ultrasound, Ionotophoresis /ml Dexamethasone, Manual therapy, and Re-evaluation  PLAN FOR NEXT SESSION: per protocol    Riki Altes, PTA  02/16/23 10:46 AM

## 2023-02-23 ENCOUNTER — Ambulatory Visit (HOSPITAL_BASED_OUTPATIENT_CLINIC_OR_DEPARTMENT_OTHER): Payer: No Typology Code available for payment source | Admitting: Physical Therapy

## 2023-02-23 ENCOUNTER — Encounter (HOSPITAL_BASED_OUTPATIENT_CLINIC_OR_DEPARTMENT_OTHER): Payer: Self-pay | Admitting: Physical Therapy

## 2023-02-23 DIAGNOSIS — M25511 Pain in right shoulder: Secondary | ICD-10-CM

## 2023-02-23 DIAGNOSIS — R600 Localized edema: Secondary | ICD-10-CM

## 2023-02-23 DIAGNOSIS — M25611 Stiffness of right shoulder, not elsewhere classified: Secondary | ICD-10-CM

## 2023-02-23 DIAGNOSIS — M6281 Muscle weakness (generalized): Secondary | ICD-10-CM

## 2023-02-23 NOTE — Therapy (Signed)
OUTPATIENT PHYSICAL THERAPY SHOULDER TREATMENT    Patient Name: Andrew Cunningham MRN: [id number] DOB:1971-04-23, 52 y.o., male Today's Date: 02/23/2023  END OF SESSION:             Past Medical History:  Diagnosis Date   Anxiety    per patient    Cervical spine arthritis    COVID-19    ED (erectile dysfunction)    GERD (gastroesophageal reflux disease)    per patient    Hyperlipemia    per patient    Hypertension    per patient    PONV (postoperative nausea and vomiting)    Situational stress    Past Surgical History:  Procedure Laterality Date   APPENDECTOMY  2005   HERNIA REPAIR  1992   SHOULDER ARTHROSCOPY WITH LABRAL REPAIR Right 11/11/2022   Procedure: RIGHT SHOULDER ARTHROSCOPY WITH LABRAL REPAIR AND LOOSE BODY REMOVAL;  Surgeon: Huel Cote, MD;  Location: [facility];  Service: Orthopedics;  Laterality: Right;   Patient Active Problem List   Diagnosis Date Noted   Labral tear of shoulder, right, initial encounter 11/11/2022   Cervical spine arthritis 08/22/2020   History of anxiety 08/22/2020   History of gastroesophageal reflux (GERD) 08/22/2020   Essential hypertension 08/22/2020   History of hyperlipidemia 08/22/2020    PCP: Tally Joe MD   REFERRING PROVIDER: Huel Cote, MD  REFERRING DIAG:  [phone] (ICD-10-CM) - Labral tear of shoulder, right, initial encounter    THERAPY DIAG:  No diagnosis found.  Rationale for Evaluation and Treatment: Rehabilitation  ONSET DATE: 10/15/2022  SUBJECTIVE:                                                                                                                                                                                      SUBJECTIVE STATEMENT: Pt reports mild soreness after last session. "I stretched before I came here today."   PERTINENT HISTORY:   PAIN:  Are you having pain? Yes: NPRS scale: 1/10 at rest  Pain location: R shoulder  Pain description:  dull   Aggravating factors: jolts/bumping into stuff  Relieving factors: iceman, medicine  PRECAUTIONS: Other: NWB R UE, superior and inferior labral repair; PROM to tolerance per MD    WEIGHT BEARING RESTRICTIONS: Yes NWB RUE   FALLS:  Has patient fallen in last 6 months? 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 Steward Drone January 30th or 31st   OBJECTIVE:   DIAGNOSTIC FINDINGS:  IMPRESSION: 1. Rotator cuff tendinosis without tear. 2. Tear of the posterosuperior labrum. A multilobulated paralabral cyst extends along the posterior aspect of the glenohumeral joint extending towards the quadrilateral space measuring approximately 3.3 x 0.9 x 1.1 cm. 3. Mild degenerative changes of the [facility] joint with trace AC joint effusion. Mild pericapsular edema may be reactive or posttraumatic. 4. Trace subacromial-subdeltoid bursitis.  PROCEDURE: 1. Right shoulder superior labral repair 2. Right shoulder inferior labral repair 3. Right shoulder limited debridement  POSTOPERATIVE PLAN: He will be nonweight bearing on the right arm pending physical therapy. He will be mobilization and passive range of motion. He will be placed on aspirin for blood clot prevention  COGNITION: Overall cognitive status: Within functional limits for tasks assessed     SENSATION: WFL  POSTURE: 3/20: mild scapular winging bil, ribcage flare   UPPER EXTREMITY ROM:   Passive ROM Right eval RIght 3/20   Shoulder flexion 70* 130 160  Shoulder extension     Shoulder abduction 25* 130 156   Shoulder adduction     Shoulder internal rotation     Shoulder external rotation -45* limited by pain   58  Elbow flexion     Elbow extension     Wrist flexion     Wrist extension     Wrist ulnar deviation     Wrist radial deviation     Wrist pronation     Wrist supination     (Blank rows = not tested)  UPPER EXTREMITY MMT:  MMT (lb) Right 3/20 Left 3/20  Shoulder flexion 18.4 33.8  Shoulder extension 31.5 44.7  Shoulder abduction 9.9 31.5  Shoulder adduction    Shoulder internal rotation 17.5 23.8  Shoulder external rotation 12.4 23.2  Middle trapezius    Lower trapezius    Elbow flexion    Elbow extension    (Blank rows = not tested)  DNT due to new surgery, restrictions of protocol     TODAY'S TREATMENT:                                                                                                                                         DATE:  4/29  UBE fwd and 2 min back  SL ER 3x10 3lb RPE of 2-3 4 lbs RPE of 5-6  Standing er band racket specific 3x10 yellow RPE of 6  Standing racket service below 90 yellow  RPE 3 3x10  Backhand RPE 4 3x10 yellow  Upright row 10 lbs 3x10  Upright shoulder extension 3x10   Ball v wall 4 way x15 2x     4/22  PROM all planes Wall push ups 2x10 Standing flexion to 90  with contralateral hold at 90. 3# 2x10ea Standing abduction 2x10 3lbs Bicep curl to [facility] press 3# 2x10 IR at cable column-5lbs 2x10ER with GTB 2x10 Standing cbale row bilateral-25lb 2x10 Bilateral ER RTB 2x10 Prone flexion 2# x10 Prone ext 2# x10 Prone H abd (palm down and thumb up) x10ea 0# 90/90ER prone 2# x10  4/15  PROM all planes Wall push ups 2x10 Standing flexion to 90 with contralateral hold at 90. 3# x10ea Standing abduction 2x10 3lbs Triceps machine (white) 25lbs 2x10 Lat pulls at cable column- 25lbs 2x10 Wall clocks RTB 2x5R ER at wall 90/90 x5 Bilateral ER RTB 2x10      PATIENT EDUCATION: Education details: intervention  purpose  Person educated: Patient and Spouse Education method: Explanation, Demonstration, and Handouts 3Education comprehension: verbalized understanding and returned demonstration  HOME EXERCISE PROGRAM: Access Code: [email or web address] URL: [email or web address]   ASSESSMENT:  CLINICAL IMPRESSION: Patient tolerated treatment well. We started more specific training. He tolerated very well. He was given a light band to practice these movements at home. We also reviewed his gym exercises.  He did well. He was shown the RPE sheet and advised to try to keep it around a 5-6 as far as his exertion goes. We adjusted his ER to put I'm in that range. His range is full and his strength is improving. He would benefit from further skilled therapy 2W6 to return towards return to golf, ADL's and racquet sports.   OBJECTIVE IMPAIRMENTS: decreased knowledge of condition, decreased ROM, decreased strength, hypomobility, increased edema, increased fascial restrictions, increased muscle spasms, impaired flexibility, impaired UE functional use, and pain.   ACTIVITY LIMITATIONS: carrying, lifting, bathing, toileting, dressing, self feeding, reach over head, and hygiene/grooming  PARTICIPATION LIMITATIONS: meal prep, cleaning, laundry, medication management, interpersonal relationship, driving, shopping, community activity, and occupation  PERSONAL FACTORS: Age, Behavior pattern, Education, Fitness, and Past/current experiences are also affecting patient's functional outcome.   REHAB POTENTIAL: Excellent  CLINICAL DECISION MAKING: Stable/uncomplicated  EVALUATION COMPLEXITY: Low   GOALS: Goals reviewed with patient? Yes  SHORT TERM GOALS: Target date: 12/12/2022    Will be compliant with appropriate progressive HEP Baseline: Goal status: MET 12/15/22  2.  PROM and AAROM of surgical shoulder to be at least 150* flexion and abduction, PROM and AAROM ER/IR to be at least 45* without increase in  pain  Baseline:  Goal status:achieved  3.  Pain in surgical shoulder to be no more than 4/10 at worst  Baseline:  Goal status: achieved  4.  Will be able to perform computer tasks on a Mod(I) basis with ergonomic and supportive adjustments PRN and no increase in pain  Baseline:  Goal status: achieved   LONG TERM GOALS: Target date: POC date  MMT surgical UE and periscap musculature  to be at least 4/5  Baseline:  Goal status: INITIAL  2.  Surgical UE AROM flexion and abduction to be full, ER/IR AROM to be at least 60 degrees  Baseline:  Goal status: IN PROGRESS  3.  Will be able to perform all dressing/bathing/self care tasks on an independent basis  Baseline:  Goal status: IN PROGRESS  4.  Pain to be no more than 2/10 at worst Baseline:  Goal status: INITIAL  5.  Will tolerate light strengthening activities within protocol to assist in prep for return to sports specific tasks  Baseline:  Goal status: IN PROGRESS  PLAN:  PT FREQUENCY: Weekly  PT DURATION:end of May  PLANNED INTERVENTIONS: Therapeutic exercises, Therapeutic activity, Neuromuscular re-education,  Balance training, Gait training, Patient/Family education, Self Care, Joint mobilization, Aquatic Therapy, Dry Needling, Electrical stimulation, Cryotherapy, Moist heat, Taping, Ultrasound, Ionotophoresis 4mg /ml Dexamethasone, Manual therapy, and Re-evaluation  PLAN FOR NEXT SESSION: per protocol    Lorayne Bender PT DPT   02/23/23 8:50 AM

## 2023-03-09 ENCOUNTER — Encounter (HOSPITAL_BASED_OUTPATIENT_CLINIC_OR_DEPARTMENT_OTHER): Payer: Self-pay

## 2023-03-09 ENCOUNTER — Ambulatory Visit (HOSPITAL_BASED_OUTPATIENT_CLINIC_OR_DEPARTMENT_OTHER): Payer: No Typology Code available for payment source | Attending: Orthopaedic Surgery

## 2023-03-09 DIAGNOSIS — M6281 Muscle weakness (generalized): Secondary | ICD-10-CM | POA: Diagnosis present

## 2023-03-09 DIAGNOSIS — R600 Localized edema: Secondary | ICD-10-CM

## 2023-03-09 DIAGNOSIS — M25611 Stiffness of right shoulder, not elsewhere classified: Secondary | ICD-10-CM

## 2023-03-09 DIAGNOSIS — M25511 Pain in right shoulder: Secondary | ICD-10-CM

## 2023-03-09 NOTE — Therapy (Signed)
OUTPATIENT PHYSICAL THERAPY SHOULDER TREATMENT    Patient Name: Andrew Cunningham MRN: [id number] DOB:25-Nov-1970, 52 y.o., male Today's Date: 03/09/2023  END OF SESSION:  PT End of Session - 03/09/23 0851     Visit Number 22    Number of Visits 33    Date for PT Re-Evaluation 04/06/23    Authorization Type [employer]    Authorization Time Period 11/14/22 to 01/23/23    PT Start Time 0848    PT Stop Time 0929    PT Time Calculation (min) 41 min    Activity Tolerance Patient tolerated treatment well    Behavior During Therapy [facility] for tasks assessed/performed                       Past Medical History:  Diagnosis Date   Anxiety    per patient    Cervical spine arthritis    COVID-19    ED (erectile dysfunction)    GERD (gastroesophageal reflux disease)    per patient    Hyperlipemia    per patient    Hypertension    per patient    PONV (postoperative nausea and vomiting)    Situational stress    Past Surgical History:  Procedure Laterality Date   APPENDECTOMY  2005   HERNIA REPAIR  1992   SHOULDER ARTHROSCOPY WITH LABRAL REPAIR Right 11/11/2022   Procedure: RIGHT SHOULDER ARTHROSCOPY WITH LABRAL REPAIR AND LOOSE BODY REMOVAL;  Surgeon: Huel Cote, MD;  Location:  SURGERY CENTER;  Service: Orthopedics;  Laterality: Right;   Patient Active Problem List   Diagnosis Date Noted   Labral tear of shoulder, right, initial encounter 11/11/2022   Cervical spine arthritis 08/22/2020   History of anxiety 08/22/2020   History of gastroesophageal reflux (GERD) 08/22/2020   Essential hypertension 08/22/2020   History of hyperlipidemia 08/22/2020    PCP: Tally Joe MD   REFERRING PROVIDER: Huel Cote, MD  REFERRING DIAG:  [phone] (ICD-10-CM) - Labral tear of shoulder, right, initial encounter    THERAPY DIAG:  Stiffness of right shoulder, not elsewhere classified  Muscle weakness (generalized)  Acute pain of right shoulder  Localized  edema  Rationale for Evaluation and Treatment: Rehabilitation  ONSET DATE: 10/15/2022  SUBJECTIVE:                                                                                                                                                                                      SUBJECTIVE STATEMENT: Pt reports no soreness after last session. He states he has not been very compliant    PERTINENT HISTORY:   PAIN:  Are you having pain? Yes:  NPRS scale: 1/10 at rest  Pain location: R shoulder  Pain description: dull   Aggravating factors: jolts/bumping into stuff  Relieving factors: iceman, medicine  PRECAUTIONS: Other: NWB R UE, superior and inferior labral repair; PROM to tolerance per MD    WEIGHT BEARING RESTRICTIONS: Yes NWB RUE   FALLS:  Has patient fallen in last 6 months? 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 Steward Drone January 30th or 31st   OBJECTIVE:   DIAGNOSTIC FINDINGS:  IMPRESSION: 1. Rotator cuff tendinosis without tear. 2. Tear of the posterosuperior labrum. A multilobulated paralabral cyst extends along the posterior aspect of the glenohumeral joint extending towards the quadrilateral space measuring approximately 3.3 x 0.9 x 1.1 cm. 3. Mild degenerative changes of the [facility] joint with trace AC joint effusion. Mild pericapsular edema may be reactive or posttraumatic. 4. Trace subacromial-subdeltoid bursitis.  PROCEDURE: 1. Right shoulder superior labral repair 2. Right shoulder inferior labral repair 3. Right shoulder limited  debridement  POSTOPERATIVE PLAN: He will be nonweight bearing on the right arm pending physical therapy. He will be mobilization and passive range of motion. He will be placed on aspirin for blood clot prevention  COGNITION: Overall cognitive status: Within functional limits for tasks assessed     SENSATION: WFL  POSTURE: 3/20: mild scapular winging bil, ribcage flare   UPPER EXTREMITY ROM:   Passive ROM Right eval RIght 3/20   Shoulder flexion 70* 130 160  Shoulder extension     Shoulder abduction 25* 130 156  Shoulder adduction     Shoulder internal rotation     Shoulder external rotation -45* limited by pain   58  Elbow flexion     Elbow extension     Wrist flexion     Wrist extension     Wrist ulnar deviation     Wrist radial deviation     Wrist pronation     Wrist supination     (Blank rows = not tested)  UPPER EXTREMITY MMT:  MMT (lb) Right 3/20 Left 3/20  Shoulder flexion 18.4 33.8  Shoulder extension 31.5 44.7  Shoulder abduction 9.9 31.5  Shoulder adduction    Shoulder internal rotation 17.5 23.8  Shoulder external rotation 12.4 23.2  Middle trapezius    Lower trapezius    Elbow flexion    Elbow extension    (Blank rows = not tested)  DNT due to new surgery, restrictions of protocol     TODAY'S TREATMENT:  DATE:   5/13 UBE fwd and 2 min back  L3  PROM R shoulder  SL ER 3x10 4lb 3x10 Prone flexion 2# 2x10 Prone rhomboid and low trap 2x10ea 2# Body blade 3 way 3x15 seconds each [place] flexion at cable column 7.5 lbs Bent over row 20lbs 2x10 PNF d2 at cable column x10 5 lbs Push ups at plinth 2x10 Standing racket swing simulation with blue TB x20  4/29  UBE fwd and 2 min back  SL ER 3x10 3lb RPE of 2-3 4 lbs RPE of 5-6  Standing er band racket specific 3x10 yellow RPE of 6  Standing racket  service below 90 yellow  RPE 3 3x10  Backhand RPE 4 3x10 yellow  Upright row 10 lbs 3x10  Upright shoulder extension 3x10   Ball v wall 4 way x15 2x     4/22  PROM all planes Wall push ups 2x10 Standing flexion to 90 with contralateral hold at 90. 3# 2x10ea Standing abduction 2x10 3lbs Bicep curl to [facility] press 3# 2x10 IR at cable column-5lbs 2x10ER with GTB 2x10 Standing cbale row bilateral-25lb 2x10 Bilateral ER RTB 2x10 Prone flexion 2# x10 Prone ext 2# x10 Prone H abd (palm down and thumb up) x10ea 0# 90/90ER prone 2# x10  4/15  PROM all planes Wall push ups 2x10 Standing flexion to 90 with contralateral hold at 90. 3# x10ea Standing abduction 2x10 3lbs Triceps machine (white) 25lbs 2x10 Lat pulls at cable column- 25lbs 2x10 Wall clocks RTB 2x5R ER at wall 90/90 x5 Bilateral ER RTB 2x10      PATIENT EDUCATION: Education details: intervention purpose  Person educated: Patient and Spouse Education method: Explanation, Demonstration, and Handouts 3Education comprehension: verbalized understanding and returned demonstration  HOME EXERCISE PROGRAM: Access Code: [email or web address] URL: [email or web address]   ASSESSMENT:  CLINICAL IMPRESSION: Pt tight in lats today with passive abduction. Remains tight into IR as well. Instructed pt in sleeper stretch to work on this at home. Continued with progressions to resisted exercise. Pt was most challenged by PNF diagonal with 5lbs at cable column as well as plinth push ups. He denied pain with exercises but did have mild fatigue. Will progress as tolerated.   OBJECTIVE IMPAIRMENTS: decreased knowledge of condition, decreased ROM, decreased strength, hypomobility, increased edema, increased fascial restrictions, increased muscle spasms, impaired flexibility, impaired UE functional use, and pain.   ACTIVITY LIMITATIONS: carrying, lifting, bathing, toileting, dressing, self feeding, reach over head, and  hygiene/grooming  PARTICIPATION LIMITATIONS: meal prep, cleaning, laundry, medication management, interpersonal relationship, driving, shopping, community activity, and occupation  PERSONAL FACTORS: Age, Behavior pattern, Education, Fitness, and Past/current experiences are also affecting patient's functional outcome.   REHAB POTENTIAL: Excellent  CLINICAL DECISION MAKING: Stable/uncomplicated  EVALUATION COMPLEXITY: Low   GOALS: Goals reviewed with patient? Yes  SHORT TERM GOALS: Target date: 12/12/2022    Will be compliant with appropriate progressive HEP Baseline: Goal status: MET 12/15/22  2.  PROM and AAROM of surgical shoulder to be at least 150* flexion and abduction, PROM and AAROM ER/IR to be at least 45* without increase in pain  Baseline:  Goal status:achieved  3.  Pain in surgical shoulder to be no more than 4/10 at worst  Baseline:  Goal status: achieved  4.  Will be able to perform computer tasks on a Mod(I) basis with ergonomic and supportive adjustments PRN and no increase in pain  Baseline:  Goal status: achieved   LONG TERM GOALS: Target date: POC date  MMT  surgical UE and periscap musculature  to be at least 4/5  Baseline:  Goal status: INITIAL  2.  Surgical UE AROM flexion and abduction to be full, ER/IR AROM to be at least 60 degrees  Baseline:  Goal status: IN PROGRESS  3.  Will be able to perform all dressing/bathing/self care tasks on an independent basis  Baseline:  Goal status: IN PROGRESS  4.  Pain to be no more than 2/10 at worst Baseline:  Goal status: IN PROGRESS (3/10 at worst)  5.  Will tolerate light strengthening activities within protocol to assist in prep for return to sports specific tasks  Baseline:  Goal status: IN PROGRESS  PLAN:  PT FREQUENCY: Weekly  PT DURATION:end of May  PLANNED INTERVENTIONS: Therapeutic exercises, Therapeutic activity, Neuromuscular re-education, Balance training, Gait training,  Patient/Family education, Self Care, Joint mobilization, Aquatic Therapy, Dry Needling, Electrical stimulation, Cryotherapy, Moist heat, Taping, Ultrasound, Ionotophoresis 4mg /ml Dexamethasone, Manual therapy, and Re-evaluation  PLAN FOR NEXT SESSION: per protocol    Riki Altes, PTA   03/09/23 10:02 AM

## 2023-03-16 ENCOUNTER — Encounter (HOSPITAL_BASED_OUTPATIENT_CLINIC_OR_DEPARTMENT_OTHER): Payer: Self-pay | Admitting: Physical Therapy

## 2023-03-16 ENCOUNTER — Ambulatory Visit (HOSPITAL_BASED_OUTPATIENT_CLINIC_OR_DEPARTMENT_OTHER): Payer: No Typology Code available for payment source | Admitting: Physical Therapy

## 2023-03-16 DIAGNOSIS — M25511 Pain in right shoulder: Secondary | ICD-10-CM

## 2023-03-16 DIAGNOSIS — M6281 Muscle weakness (generalized): Secondary | ICD-10-CM

## 2023-03-16 DIAGNOSIS — M25611 Stiffness of right shoulder, not elsewhere classified: Secondary | ICD-10-CM

## 2023-03-16 DIAGNOSIS — R600 Localized edema: Secondary | ICD-10-CM

## 2023-03-16 NOTE — Therapy (Signed)
OUTPATIENT PHYSICAL THERAPY SHOULDER TREATMENT    Patient Name: Andrew Cunningham MRN: [id number] DOB:22-Sep-1971, 52 y.o., male Today's Date: 03/16/2023  END OF SESSION:  PT End of Session - 03/16/23 0855     Visit Number 23    Number of Visits 33    Date for PT Re-Evaluation 04/06/23    Authorization Type [employer]    Authorization Time Period 11/14/22 to 01/23/23                       Past Medical History:  Diagnosis Date   Anxiety    per patient    Cervical spine arthritis    COVID-19    ED (erectile dysfunction)    GERD (gastroesophageal reflux disease)    per patient    Hyperlipemia    per patient    Hypertension    per patient    PONV (postoperative nausea and vomiting)    Situational stress    Past Surgical History:  Procedure Laterality Date   APPENDECTOMY  2005   HERNIA REPAIR  1992   SHOULDER ARTHROSCOPY WITH LABRAL REPAIR Right 11/11/2022   Procedure: RIGHT SHOULDER ARTHROSCOPY WITH LABRAL REPAIR AND LOOSE BODY REMOVAL;  Surgeon: Huel Cote, MD;  Location:  SURGERY CENTER;  Service: Orthopedics;  Laterality: Right;   Patient Active Problem List   Diagnosis Date Noted   Labral tear of shoulder, right, initial encounter 11/11/2022   Cervical spine arthritis 08/22/2020   History of anxiety 08/22/2020   History of gastroesophageal reflux (GERD) 08/22/2020   Essential hypertension 08/22/2020   History of hyperlipidemia 08/22/2020    PCP: Tally Joe MD   REFERRING PROVIDER: Huel Cote, MD  REFERRING DIAG:  [phone] (ICD-10-CM) - Labral tear of shoulder, right, initial encounter    THERAPY DIAG:  No diagnosis found.  Rationale for Evaluation and Treatment: Rehabilitation  ONSET DATE: 10/15/2022  SUBJECTIVE:                                                                                                                                                                                      SUBJECTIVE STATEMENT: Pt  reports he was able to play pickle ball. He felt pretty good with it. He has also hit some golf ball   PERTINENT HISTORY:   PAIN:  Are you having pain? Yes: NPRS scale: 1/10 at rest  Pain location: R shoulder  Pain description: dull   Aggravating factors: jolts/bumping into stuff  Relieving factors: iceman, medicine  PRECAUTIONS: Other: NWB R UE, superior and inferior labral repair; PROM to tolerance per MD    WEIGHT BEARING RESTRICTIONS: Yes NWB RUE  FALLS:  Has patient fallen in last 6 months? 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 Steward Drone January 30th or 31st   OBJECTIVE:   DIAGNOSTIC FINDINGS:  IMPRESSION: 1. Rotator cuff tendinosis without tear. 2. Tear of the posterosuperior labrum. A multilobulated paralabral cyst extends along the posterior aspect of the glenohumeral joint extending towards the quadrilateral space measuring approximately 3.3 x 0.9 x 1.1 cm. 3. Mild degenerative changes of the [facility] joint with trace AC joint effusion. Mild pericapsular edema may be reactive or posttraumatic. 4. Trace subacromial-subdeltoid bursitis.  PROCEDURE: 1. Right shoulder superior labral repair 2. Right shoulder inferior labral repair 3. Right shoulder limited debridement  POSTOPERATIVE PLAN: He will be nonweight bearing on the right arm pending physical therapy. He will be mobilization and passive range of motion. He will be placed on aspirin for blood clot prevention  COGNITION: Overall cognitive status: Within functional limits for tasks  assessed     SENSATION: WFL  POSTURE: 3/20: mild scapular winging bil, ribcage flare   UPPER EXTREMITY ROM:   Passive ROM Right eval RIght 3/20   Shoulder flexion 70* 130 160  Shoulder extension     Shoulder abduction 25* 130 156  Shoulder adduction     Shoulder internal rotation     Shoulder external rotation -45* limited by pain   58  Elbow flexion     Elbow extension     Wrist flexion     Wrist extension     Wrist ulnar deviation     Wrist radial deviation     Wrist pronation     Wrist supination     (Blank rows = not tested)  UPPER EXTREMITY MMT:  MMT (lb) Right 3/20 Left 3/20  Shoulder flexion 18.4 33.8  Shoulder extension 31.5 44.7  Shoulder abduction 9.9 31.5  Shoulder adduction    Shoulder internal rotation 17.5 23.8  Shoulder external rotation 12.4 23.2  Middle trapezius    Lower trapezius    Elbow flexion    Elbow extension    (Blank rows = not tested)  DNT due to new surgery, restrictions of protocol     TODAY'S TREATMENT:                                                                                                                                         DATE:  5/20 UBE fwd and 2 min back  L3 Standing racket swing simulation with blue TB x20 Standing flexion 3lbs 2x15 Standing Scaption  2x15 3lbs    Biceps curl 20 lb straight bar RPE of 5      Bent over row 20lbs 2x10  Discussion of RPE for grading his weight/sets/reps/ Patient given RPE and Reps in reserve sheet.     5/13 UBE fwd and 2 min back  L3  PROM R shoulder  SL ER 3x10 4lb 3x10 Prone flexion 2# 2x10 Prone rhomboid and low trap 2x10ea 2# Body blade 3 way 3x15 seconds each [place] flexion at cable column 7.5 lbs Bent over row 20lbs 2x10 PNF d2 at cable column x10 5 lbs Push ups at plinth 2x10 Standing racket swing simulation with blue TB x20  4/29  UBE fwd and 2 min back  SL ER 3x10 3lb RPE of 2-3 4 lbs RPE of 5-6  Standing er band racket specific  3x10 yellow RPE of 6  Standing racket service below 90 yellow  RPE 3 3x10  Backhand RPE 4 3x10 yellow  Upright row 10 lbs 3x10  Upright shoulder extension 3x10   Ball v wall 4 way x15 2x   Biceps curl 20 lb straight bar RPE of       PATIENT EDUCATION: Education details: intervention purpose  Person educated: Patient and Spouse Education method: Explanation, Demonstration, and Handouts 3Education comprehension: verbalized understanding and returned demonstration  HOME EXERCISE PROGRAM: Access Code: [email or web address] URL: [email or web address]   ASSESSMENT:  CLINICAL IMPRESSION: The patient is making great progress. He tolerated there-x well. We took time today and talked about gearing his exercises to maximize strength and minimize danger. He did well. Next visit we will review HEP; look at strength measurements and likely D/C to HEP.  OBJECTIVE IMPAIRMENTS: decreased knowledge of condition, decreased ROM, decreased strength, hypomobility, increased edema, increased fascial restrictions, increased muscle spasms, impaired flexibility, impaired UE functional use, and pain.   ACTIVITY LIMITATIONS: carrying, lifting, bathing, toileting, dressing, self feeding, reach over head, and hygiene/grooming  PARTICIPATION LIMITATIONS: meal prep, cleaning, laundry, medication management, interpersonal relationship, driving, shopping, community activity, and occupation  PERSONAL FACTORS: Age, Behavior pattern, Education, Fitness, and Past/current experiences are also affecting patient's functional outcome.   REHAB POTENTIAL: Excellent  CLINICAL DECISION MAKING: Stable/uncomplicated  EVALUATION COMPLEXITY: Low   GOALS: Goals reviewed with patient? Yes  SHORT TERM GOALS: Target date: 12/12/2022    Will be compliant with appropriate progressive HEP Baseline: Goal status: MET 12/15/22  2.  PROM and AAROM of surgical shoulder to be at least 150* flexion and abduction, PROM and  AAROM ER/IR to be at least 45* without increase in pain  Baseline:  Goal status:achieved  3.  Pain in surgical shoulder to be no more than 4/10 at worst  Baseline:  Goal status: achieved  4.  Will be able to perform computer tasks on a Mod(I) basis with ergonomic and supportive adjustments PRN and no increase in pain  Baseline:  Goal status: achieved   LONG TERM GOALS: Target date: POC date  MMT surgical UE and periscap musculature  to be at least 4/5  Baseline:  Goal status: INITIAL  2.  Surgical UE AROM flexion and abduction to be full, ER/IR AROM to be at least 60 degrees  Baseline:  Goal status: IN PROGRESS  3.  Will be able to perform all dressing/bathing/self care tasks on an independent basis  Baseline:  Goal status: IN PROGRESS  4.  Pain to be no more than 2/10 at worst Baseline:  Goal status: IN PROGRESS (3/10 at worst)  5.  Will  tolerate light strengthening activities within protocol to assist in prep for return to sports specific tasks  Baseline:  Goal status: IN PROGRESS  PLAN:  PT FREQUENCY: Weekly  PT DURATION:end of May  PLANNED INTERVENTIONS: Therapeutic exercises, Therapeutic activity, Neuromuscular re-education, Balance training, Gait training, Patient/Family education, Self Care, Joint mobilization, Aquatic Therapy, Dry Needling, Electrical stimulation, Cryotherapy, Moist heat, Taping, Ultrasound, Ionotophoresis 4mg /ml Dexamethasone, Manual therapy, and Re-evaluation  PLAN FOR NEXT SESSION: per protocol   Lorayne Bender PT DPT   03/16/23 8:57 AM

## 2023-03-24 ENCOUNTER — Ambulatory Visit (HOSPITAL_BASED_OUTPATIENT_CLINIC_OR_DEPARTMENT_OTHER): Payer: No Typology Code available for payment source | Admitting: Physical Therapy

## 2023-03-24 DIAGNOSIS — R600 Localized edema: Secondary | ICD-10-CM

## 2023-03-24 DIAGNOSIS — M6281 Muscle weakness (generalized): Secondary | ICD-10-CM

## 2023-03-24 DIAGNOSIS — M25511 Pain in right shoulder: Secondary | ICD-10-CM

## 2023-03-24 DIAGNOSIS — M25611 Stiffness of right shoulder, not elsewhere classified: Secondary | ICD-10-CM

## 2023-03-24 NOTE — Therapy (Signed)
OUTPATIENT PHYSICAL THERAPY SHOULDER TREATMENT    Patient Name: Andrew Cunningham MRN: [id number] DOB:[birth year], 52 y.o., male Today's Date: 03/24/2023  END OF SESSION:              Past Medical History:  Diagnosis Date   Anxiety    per patient    Cervical spine arthritis    COVID-19    ED (erectile dysfunction)    GERD (gastroesophageal reflux disease)    per patient    Hyperlipemia    per patient    Hypertension    per patient    PONV (postoperative nausea and vomiting)    Situational stress    Past Surgical History:  Procedure Laterality Date   APPENDECTOMY  2005   HERNIA REPAIR  1992   SHOULDER ARTHROSCOPY WITH LABRAL REPAIR Right 11/11/2022   Procedure: RIGHT SHOULDER ARTHROSCOPY WITH LABRAL REPAIR AND LOOSE BODY REMOVAL;  Surgeon: Huel Cote, MD;  [facility]  [facility];  Service: Orthopedics;  Laterality: Right;   Patient Active Problem List   Diagnosis Date Noted   Labral tear of shoulder, right, initial encounter 11/11/2022   Cervical spine arthritis 08/22/2020   History of anxiety 08/22/2020   History of gastroesophageal reflux (GERD) 08/22/2020   Essential hypertension 08/22/2020   History of hyperlipidemia 08/22/2020    PCP: Tally Joe MD   REFERRING PROVIDER: Huel Cote, MD  REFERRING DIAG:  [phone] (ICD-10-CM) - Labral tear of shoulder, right, initial encounter    THERAPY DIAG:  No diagnosis found.  Rationale for Evaluation and Treatment: Rehabilitation  ONSET DATE: 10/15/2022  SUBJECTIVE:                                                                                                                                                                                      SUBJECTIVE STATEMENT: Pt reports he was able to play pickle ball. He felt pretty good with it. He has also hit some golf ball   PERTINENT HISTORY:   PAIN:  Are you having pain? Yes: NPRS scale: 1/10 at rest  Pain location: R shoulder   Pain description: dull   Aggravating factors: jolts/bumping into stuff  Relieving factors: iceman, medicine  PRECAUTIONS: Other: NWB R UE, superior and inferior labral repair; PROM to tolerance per MD    WEIGHT BEARING RESTRICTIONS: Yes NWB RUE   FALLS:  Has patient fallen in last 6 months? 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 Steward Drone January 30th or 31st   OBJECTIVE:   DIAGNOSTIC FINDINGS:  IMPRESSION: 1. Rotator cuff tendinosis without tear. 2. Tear of the posterosuperior labrum. A multilobulated paralabral cyst extends along the posterior aspect of the glenohumeral joint extending towards the quadrilateral space measuring approximately 3.3 x 0.9 x 1.1 cm. 3. Mild degenerative changes of the [facility] joint with trace AC joint effusion. Mild pericapsular edema may be reactive or posttraumatic. 4. Trace subacromial-subdeltoid bursitis.  PROCEDURE: 1. Right shoulder superior labral repair 2. Right shoulder inferior labral repair 3. Right shoulder limited debridement  POSTOPERATIVE PLAN: He will be nonweight bearing on the right arm pending physical therapy. He will be mobilization and passive range of motion. He will be placed on aspirin for blood clot prevention  COGNITION: Overall cognitive status: Within functional limits for tasks assessed     SENSATION: WFL  POSTURE: 3/20: mild scapular winging bil, ribcage flare   UPPER EXTREMITY ROM:   Passive ROM Right eval RIght 3/20   Shoulder flexion 70* 130 160  Shoulder extension     Shoulder  abduction 25* 130 156  Shoulder adduction     Shoulder internal rotation     Shoulder external rotation -45* limited by pain   58  Elbow flexion     Elbow extension     Wrist flexion     Wrist extension     Wrist ulnar deviation     Wrist radial deviation     Wrist pronation     Wrist supination     (Blank rows = not tested)  UPPER EXTREMITY MMT:  MMT (lb) Right 3/20 Left 3/20 Right  5/28 Left 5/28  Shoulder flexion 18.4 33.8 12.1 14.7  Shoulder extension 31.5 44.7    Shoulder abduction 9.9 31.5 7.5 9.7  Shoulder adduction      Shoulder internal rotation 17.5 23.8 23.2 21.8  Shoulder external rotation 12.4 23.2 16.0 26.5  Middle trapezius      Lower trapezius      Elbow flexion      Elbow extension      (Blank rows = not tested)  DNT due to new surgery, restrictions of protocol     TODAY'S TREATMENT:                                                                                                                                         DATE:  5/28 Reviewed strength tests and measurements; reviewed goals going forward and which exercises to do. Review of goals  Reviewed ROM  Reviewed RPE and how to use  UBE fwd and 2 min back  L3  Wall walk red band 5x  Wall clock 5x each red   Band  back hand 2x15 red  Band overhead 2x15 red    Side lying ER 3x10 4 lbs   5/20 UBE fwd and 2 min back  L3 Standing racket swing simulation with blue TB x20 Standing flexion 3lbs 2x15 Standing Scaption 2x15 3lbs    Biceps curl 20 lb straight bar RPE of 5      Bent over row 20lbs 2x10  Discussion of RPE for grading his weight/sets/reps/ Patient given RPE and Reps in reserve sheet.     5/13 UBE fwd and 2 min back  L3  PROM R shoulder  SL ER 3x10 4lb 3x10 Prone flexion 2# 2x10 Prone rhomboid and low trap 2x10ea 2# Body blade 3 way 3x15 seconds each [place] flexion at cable column 7.5 lbs Bent over row 20lbs 2x10 PNF d2 at cable column x10 5  lbs Push ups at plinth 2x10 Standing racket swing simulation with blue TB x20  4/29  UBE fwd and 2 min back  SL ER 3x10 3lb RPE of 2-3 4 lbs RPE of 5-6  Standing er band racket specific 3x10 yellow RPE of 6  Standing racket service below 90 yellow  RPE 3 3x10  Backhand RPE 4 3x10 yellow  Upright row 10 lbs 3x10  Upright shoulder extension 3x10   Ball v wall 4 way x15 2x   Biceps curl 20 lb straight bar RPE of       PATIENT EDUCATION: Education details: intervention purpose  Person educated: Patient and Spouse Education method: Explanation, Demonstration, and Handouts 3Education comprehension: verbalized understanding and returned demonstration  HOME EXERCISE PROGRAM: Access Code: [email or web address] URL: [email or web address]   ASSESSMENT:  CLINICAL IMPRESSION: Therapy performed formal strength testing again.  he continues to have a 40% deficit in ER> He is 25% bettr then last time though. He was encouraged to continue with his strengthening over the next 3 months. We reviewed the exercises to do and how to use RPE to progress sets and resps. He is sore when he does to much. Otherwise he is having very little pain. He has full ROM. He is back to palying golf. We will leave his case open in case he has any issues as he progresses back to tennis.  OBJECTIVE IMPAIRMENTS: decreased knowledge of condition, decreased ROM, decreased strength, hypomobility, increased edema, increased fascial restrictions, increased muscle spasms, impaired flexibility, impaired UE functional use, and pain.   ACTIVITY LIMITATIONS: carrying, lifting, bathing, toileting, dressing, self feeding, reach over head, and hygiene/grooming  PARTICIPATION LIMITATIONS: meal prep, cleaning, laundry, medication management, interpersonal relationship, driving, shopping, community activity, and occupation  PERSONAL FACTORS: Age, Behavior pattern, Education, Fitness, and Past/current experiences are also  affecting patient's functional outcome.   REHAB POTENTIAL: Excellent  CLINICAL DECISION MAKING: Stable/uncomplicated  EVALUATION COMPLEXITY: Low   GOALS: Goals reviewed with patient? Yes  SHORT TERM GOALS: Target date: 12/12/2022    Will be compliant with appropriate progressive HEP Baseline: Goal status: MET 12/15/22  2.  PROM and AAROM of surgical shoulder to be at least 150* flexion and abduction, PROM and AAROM ER/IR to be at least 45* without increase in pain  Baseline:  Goal status:achieved  3.  Pain in surgical shoulder to be no more than 4/10 at worst  Baseline:  Goal status: achieved  4.  Will be able to perform computer tasks on a Mod(I) basis with ergonomic and supportive adjustments PRN and no increase in pain  Baseline:  Goal status: achieved   LONG TERM GOALS: Target date:  POC date  MMT surgical UE and periscap musculature  to be at least 4/5  Baseline:  Goal status: INITIAL  2.  Surgical UE AROM flexion and abduction to be full, ER/IR AROM to be at least 60 degrees  Baseline:  Goal status: IN PROGRESS  3.  Will be able to perform all dressing/bathing/self care tasks on an independent basis  Baseline:  Goal status: IN PROGRESS  4.  Pain to be no more than 2/10 at worst Baseline:  Goal status: IN PROGRESS (3/10 at worst)  5.  Will tolerate light strengthening activities within protocol to assist in prep for return to sports specific tasks  Baseline:  Goal status: IN PROGRESS  PLAN:  PT FREQUENCY: Weekly  PT DURATION:end of May  PLANNED INTERVENTIONS: Therapeutic exercises, Therapeutic activity, Neuromuscular re-education, Balance training, Gait training, Patient/Family education, Self Care, Joint mobilization, Aquatic Therapy, Dry Needling, Electrical stimulation, Cryotherapy, Moist heat, Taping, Ultrasound, Ionotophoresis 4mg /ml Dexamethasone, Manual therapy, and Re-evaluation  PLAN FOR NEXT SESSION: per protocol   Lorayne Bender PT DPT    03/24/23 9:00 AM

## 2023-03-25 ENCOUNTER — Encounter (HOSPITAL_BASED_OUTPATIENT_CLINIC_OR_DEPARTMENT_OTHER): Payer: Self-pay | Admitting: Physical Therapy

## 2023-04-08 ENCOUNTER — Encounter (HOSPITAL_BASED_OUTPATIENT_CLINIC_OR_DEPARTMENT_OTHER): Payer: Self-pay | Admitting: Physical Therapy

## 2023-04-29 ENCOUNTER — Encounter (HOSPITAL_BASED_OUTPATIENT_CLINIC_OR_DEPARTMENT_OTHER): Payer: Self-pay | Admitting: Orthopaedic Surgery

## 2023-04-29 ENCOUNTER — Ambulatory Visit (HOSPITAL_BASED_OUTPATIENT_CLINIC_OR_DEPARTMENT_OTHER): Payer: No Typology Code available for payment source | Admitting: Orthopaedic Surgery

## 2023-04-29 DIAGNOSIS — M25511 Pain in right shoulder: Secondary | ICD-10-CM | POA: Diagnosis not present

## 2023-04-29 DIAGNOSIS — S43431A Superior glenoid labrum lesion of right shoulder, initial encounter: Secondary | ICD-10-CM | POA: Diagnosis not present

## 2023-04-29 MED ORDER — LIDOCAINE HCL 1 % IJ SOLN
4.0000 mL | INTRAMUSCULAR | Status: AC | PRN
  Administered 2023-04-29: 4 mL

## 2023-04-29 MED ORDER — TRIAMCINOLONE ACETONIDE 40 MG/ML IJ SUSP
80.0000 mg | INTRAMUSCULAR | Status: AC | PRN
  Administered 2023-04-29: 80 mg via INTRA_ARTICULAR

## 2023-04-29 NOTE — Progress Notes (Signed)
Post Operative Evaluation    Procedure/Date of Surgery: Right shoulder labral repair 1/16  Interval History:    Presents today for follow-up of his right shoulder.  Overall he has little to no pain with most activities including golfing.  He does experience some soreness afterwards.  He is here today for further discussion. PMH/PSH/Family History/Social History/Meds/Allergies:    Past Medical History:  Diagnosis Date  . Anxiety    per patient   . Cervical spine arthritis   . COVID-19   . ED (erectile dysfunction)   . GERD (gastroesophageal reflux disease)    per patient   . Hyperlipemia    per patient   . Hypertension    per patient   . PONV (postoperative nausea and vomiting)   . Situational stress    Past Surgical History:  Procedure Laterality Date  . APPENDECTOMY  2005  . HERNIA REPAIR  1992  . SHOULDER ARTHROSCOPY WITH LABRAL REPAIR Right 11/11/2022   Procedure: RIGHT SHOULDER ARTHROSCOPY WITH LABRAL REPAIR AND LOOSE BODY REMOVAL;  Surgeon: Huel Cote, MD;  Location: [facility];  Service: Orthopedics;  Laterality: Right;   Social History   Socioeconomic History  . Marital status: Married    Spouse name: Not on file  . Number of children: Not on file  . Years of education: Not on file  . Highest education level: Not on file  Occupational History  . Not on file  Tobacco Use  . Smoking status: Former  . Smokeless tobacco: Former    Types: Engineer, drilling  . Vaping Use: Never used  Substance and Sexual Activity  . Alcohol use: Yes    Comment: 2 drinks daily   . Drug use: Not Currently  . Sexual activity: Not on file  Other Topics Concern  . Not on file  Social History Narrative  . Not on file   Social Determinants of Health   Financial Resource Strain: Not on file  Food Insecurity: Not on file  Transportation Needs: Not on file  Physical Activity: Not on file  Stress: Not on file  Social  Connections: Not on file   Family History  Problem Relation Age of Onset  . Healthy Mother   . Arthritis Mother   . Healthy Father   . Healthy Sister   . Healthy Daughter    No Known Allergies Current Outpatient Medications  Medication Sig Dispense Refill  . ALPRAZolam (XANAX) 0.5 MG tablet Take 0.5 mg by mouth daily as needed.    Marland Kitchen aspirin EC 325 MG tablet Take 1 tablet (325 mg total) by mouth daily. 30 tablet 0  . calcium-vitamin D (OSCAL WITH D) 500-5 MG-MCG tablet Take 1 tablet by mouth.    . cetirizine (ZYRTEC) 10 MG tablet Take 10 mg by mouth daily.    . influenza vac split quadrivalent PF (FLUARIX) 0.5 ML injection Inject into the muscle. 0.5 mL 0  . losartan (COZAAR) 50 MG tablet Take 50 mg by mouth daily.    Marland Kitchen omeprazole (PRILOSEC) 10 MG capsule Take 10 mg by mouth daily.    Marland Kitchen oxyCODONE (ROXICODONE) 5 MG immediate release tablet Take 1 tablet (5 mg total) by mouth every 4 (four) hours as needed for severe pain or breakthrough pain. 30 tablet 0  . propranolol (INDERAL) 20  MG tablet Take 20 mg by mouth as needed.    . rosuvastatin (CRESTOR) 5 MG tablet Take 5 mg by mouth daily.    . vitamin B-12 (CYANOCOBALAMIN) 100 MCG tablet Take 100 mcg by mouth daily.     No current facility-administered medications for this visit.   No results found.  Review of Systems:   A ROS was performed including pertinent positives and negatives as documented in the HPI.   Musculoskeletal Exam:     Right shoulder incisions are healed.  In the sitting position he is able to forward elevate to 160 degrees with external rotation at the side to 50 degrees.  Internal rotation is to L5.  Distal neurosensory exam is intact  Imaging:      I personally reviewed and interpreted the radiographs.   Assessment:   22-month status post right shoulder labral repair overall doing well.  At this time I have fully cleared him to activity.  He has experiencing some soreness after certain activities like  golfing.  At this time he is having some glenohumeral type pain with limited external rotation at the side compared to the contralateral which I do believe may be consistent with capsulitis.  Will plan an ultrasound-guided glenohumeral injection I will see him back in 3 months for final check  Plan :    -Return to clinic in 3 months for final check    Ultrasound-guided right glenohumeral injection provided after verbal consent obtained     Procedure Note  Patient: Andrew Cunningham             Date of Birth: 07-24-1971           MRN: [id number]             Visit Date: 04/29/2023  Procedures: Visit Diagnoses: No diagnosis found.  Large Joint Inj: R glenohumeral on 04/29/2023 10:22 AM Indications: pain Details: 22 G 1.5 in needle, ultrasound-guided anterior approach  Arthrogram: No  Medications: 4 mL lidocaine 1 %; 80 mg triamcinolone acetonide 40 MG/ML Outcome: tolerated well, no immediate complications Procedure, treatment alternatives, risks and benefits explained, specific risks discussed. Consent was given by the patient. Immediately prior to procedure a time out was called to verify the correct patient, procedure, equipment, support staff and site/side marked as required. Patient was prepped and draped in the usual sterile fashion.       I personally saw and evaluated the patient, and participated in the management and treatment plan.  Huel Cote, MD Attending Physician, Orthopedic Surgery  This document was dictated using Dragon voice recognition software. A reasonable attempt at proof reading has been made to minimize errors.

## 2023-07-31 ENCOUNTER — Ambulatory Visit (HOSPITAL_BASED_OUTPATIENT_CLINIC_OR_DEPARTMENT_OTHER): Payer: No Typology Code available for payment source | Admitting: Orthopaedic Surgery

## 2023-08-05 ENCOUNTER — Encounter (HOSPITAL_BASED_OUTPATIENT_CLINIC_OR_DEPARTMENT_OTHER): Payer: Self-pay | Admitting: Orthopaedic Surgery

## 2023-08-05 ENCOUNTER — Ambulatory Visit (HOSPITAL_BASED_OUTPATIENT_CLINIC_OR_DEPARTMENT_OTHER): Payer: No Typology Code available for payment source | Admitting: Orthopaedic Surgery

## 2023-08-05 DIAGNOSIS — S43431A Superior glenoid labrum lesion of right shoulder, initial encounter: Secondary | ICD-10-CM | POA: Diagnosis not present

## 2023-08-05 NOTE — Progress Notes (Signed)
Post Operative Evaluation    Procedure/Date of Surgery: Right shoulder labral repair 1/16  Interval History:    Presents today for follow-up status post the above procedure.  He is now back to golfing with only occasional mild soreness.  He does have some pain with waking although this is overall mild. PMH/PSH/Family History/Social History/Meds/Allergies:    Past Medical History:  Diagnosis Date   Anxiety    per patient    Cervical spine arthritis    COVID-19    ED (erectile dysfunction)    GERD (gastroesophageal reflux disease)    per patient    Hyperlipemia    per patient    Hypertension    per patient    PONV (postoperative nausea and vomiting)    Situational stress    Past Surgical History:  Procedure Laterality Date   APPENDECTOMY  2005   HERNIA REPAIR  1992   SHOULDER ARTHROSCOPY WITH LABRAL REPAIR Right 11/11/2022   Procedure: RIGHT SHOULDER ARTHROSCOPY WITH LABRAL REPAIR AND LOOSE BODY REMOVAL;  Surgeon: Huel Cote, MD;  Location: [facility];  Service: Orthopedics;  Laterality: Right;   Social History   Socioeconomic History   Marital status: Married    Spouse name: Not on file   Number of children: Not on file   Years of education: Not on file   Highest education level: Not on file  Occupational History   Not on file  Tobacco Use   Smoking status: Former   Smokeless tobacco: Former    Types: Engineer, drilling   Vaping status: Never Used  Substance and Sexual Activity   Alcohol use: Yes    Comment: 2 drinks daily    Drug use: Not Currently   Sexual activity: Not on file  Other Topics Concern   Not on file  Social History Narrative   Not on file   Social Determinants of Health   Financial Resource Strain: Not on file  Food Insecurity: Not on file  Transportation Needs: Not on file  Physical Activity: Not on file  Stress: Not on file  Social Connections: Not on file   Family History   Problem Relation Age of Onset   Healthy Mother    Arthritis Mother    Healthy Father    Healthy Sister    Healthy Daughter    No Known Allergies Current Outpatient Medications  Medication Sig Dispense Refill   ALPRAZolam (XANAX) 0.5 MG tablet Take 0.5 mg by mouth daily as needed.     aspirin EC 325 MG tablet Take 1 tablet (325 mg total) by mouth daily. 30 tablet 0   calcium-vitamin D (OSCAL WITH D) 500-5 MG-MCG tablet Take 1 tablet by mouth.     cetirizine (ZYRTEC) 10 MG tablet Take 10 mg by mouth daily.     influenza vac split quadrivalent PF (FLUARIX) 0.5 ML injection Inject into the muscle. 0.5 mL 0   losartan (COZAAR) 50 MG tablet Take 50 mg by mouth daily.     omeprazole (PRILOSEC) 10 MG capsule Take 10 mg by mouth daily.     oxyCODONE (ROXICODONE) 5 MG immediate release tablet Take 1 tablet (5 mg total) by mouth every 4 (four) hours as needed for severe pain or breakthrough pain. 30 tablet 0   propranolol (INDERAL) 20 MG tablet  Take 20 mg by mouth as needed.     rosuvastatin (CRESTOR) 5 MG tablet Take 5 mg by mouth daily.     vitamin B-12 (CYANOCOBALAMIN) 100 MCG tablet Take 100 mcg by mouth daily.     No current facility-administered medications for this visit.   No results found.  Review of Systems:   A ROS was performed including pertinent positives and negatives as documented in the HPI.   Musculoskeletal Exam:     Right shoulder incisions are healed.  In the sitting position he is able to forward elevate to 160 degrees with external rotation at the side to 50 degrees.  Internal rotation is to L5.  Distal neurosensory exam is intact.  Forward elevation external rotation strength equal to contralateral side  Imaging:      I personally reviewed and interpreted the radiographs.   Assessment:   81-month status post right shoulder labral repair overall doing well.  At this time I have fully cleared him to activity.  I will plan to see him back as needed.  He is doing  much better after previous capsulitis that the injection.  Range of motion and strength and I will near-normal  Plan :    Return to clinic as needed   I personally saw and evaluated the patient, and participated in the management and treatment plan.  Huel Cote, MD Attending Physician, Orthopedic Surgery  This document was dictated using Dragon voice recognition software. A reasonable attempt at proof reading has been made to minimize errors.

## 2023-08-24 ENCOUNTER — Other Ambulatory Visit (HOSPITAL_BASED_OUTPATIENT_CLINIC_OR_DEPARTMENT_OTHER): Payer: Self-pay

## 2023-08-24 MED ORDER — INFLUENZA VIRUS VACC SPLIT PF (FLUZONE) 0.5 ML IM SUSY
0.5000 mL | PREFILLED_SYRINGE | Freq: Once | INTRAMUSCULAR | 0 refills | Status: AC
  Filled 2023-08-24: qty 0.5, 1d supply, fill #0

## 2023-09-04 ENCOUNTER — Encounter (HOSPITAL_BASED_OUTPATIENT_CLINIC_OR_DEPARTMENT_OTHER): Payer: Self-pay | Admitting: Orthopaedic Surgery

## 2023-09-09 ENCOUNTER — Ambulatory Visit (INDEPENDENT_AMBULATORY_CARE_PROVIDER_SITE_OTHER): Payer: No Typology Code available for payment source | Admitting: Orthopaedic Surgery

## 2023-09-09 DIAGNOSIS — S43431A Superior glenoid labrum lesion of right shoulder, initial encounter: Secondary | ICD-10-CM | POA: Diagnosis not present

## 2023-09-09 DIAGNOSIS — M25511 Pain in right shoulder: Secondary | ICD-10-CM

## 2023-09-09 MED ORDER — LIDOCAINE HCL 1 % IJ SOLN
4.0000 mL | INTRAMUSCULAR | Status: AC | PRN
  Administered 2023-09-09: 4 mL

## 2023-09-09 MED ORDER — TRIAMCINOLONE ACETONIDE 40 MG/ML IJ SUSP
80.0000 mg | INTRAMUSCULAR | Status: AC | PRN
  Administered 2023-09-09: 80 mg via INTRA_ARTICULAR

## 2023-09-09 NOTE — Progress Notes (Signed)
Post Operative Evaluation    Procedure/Date of Surgery: Right shoulder labral repair 1/16  Interval History:    Presents today for follow-up status post the above procedure.  Andrew states that Andrew was back to having essentially no pain for the last 2 months although now Andrew is back to having soreness with certain activities.  Andrew has had no pain while golfing although there is occasional pain particularly about the shoulder blade. PMH/PSH/Family History/Social History/Meds/Allergies:    Past Medical History:  Diagnosis Date  . Anxiety    per patient   . Cervical spine arthritis   . COVID-19   . ED (erectile dysfunction)   . GERD (gastroesophageal reflux disease)    per patient   . Hyperlipemia    per patient   . Hypertension    per patient   . PONV (postoperative nausea and vomiting)   . Situational stress    Past Surgical History:  Procedure Laterality Date  . APPENDECTOMY  2005  . HERNIA REPAIR  1992  . SHOULDER ARTHROSCOPY WITH LABRAL REPAIR Right 11/11/2022   Procedure: RIGHT SHOULDER ARTHROSCOPY WITH LABRAL REPAIR AND LOOSE BODY REMOVAL;  Surgeon: Huel Cote, MD;  Location: [facility];  Service: Orthopedics;  Laterality: Right;   Social History   Socioeconomic History  . Marital status: Married    Spouse name: Not on file  . Number of children: Not on file  . Years of education: Not on file  . Highest education level: Not on file  Occupational History  . Not on file  Tobacco Use  . Smoking status: Former  . Smokeless tobacco: Former    Types: Engineer, drilling  . Vaping status: Never Used  Substance and Sexual Activity  . Alcohol use: Yes    Comment: 2 drinks daily   . Drug use: Not Currently  . Sexual activity: Not on file  Other Topics Concern  . Not on file  Social History Narrative  . Not on file   Social Determinants of Health   Financial Resource Strain: Not on file  Food Insecurity: Not on file   Transportation Needs: Not on file  Physical Activity: Not on file  Stress: Not on file  Social Connections: Not on file   Family History  Problem Relation Age of Onset  . Healthy Mother   . Arthritis Mother   . Healthy Father   . Healthy Sister   . Healthy Daughter    No Known Allergies Current Outpatient Medications  Medication Sig Dispense Refill  . ALPRAZolam (XANAX) 0.5 MG tablet Take 0.5 mg by mouth daily as needed.    Marland Kitchen aspirin EC 325 MG tablet Take 1 tablet (325 mg total) by mouth daily. 30 tablet 0  . calcium-vitamin D (OSCAL WITH D) 500-5 MG-MCG tablet Take 1 tablet by mouth.    . cetirizine (ZYRTEC) 10 MG tablet Take 10 mg by mouth daily.    . influenza vac split quadrivalent PF (FLUARIX) 0.5 ML injection Inject into the muscle. 0.5 mL 0  . losartan (COZAAR) 50 MG tablet Take 50 mg by mouth daily.    Marland Kitchen omeprazole (PRILOSEC) 10 MG capsule Take 10 mg by mouth daily.    Marland Kitchen oxyCODONE (ROXICODONE) 5 MG immediate release tablet Take 1 tablet (5 mg total) by mouth every  4 (four) hours as needed for severe pain or breakthrough pain. 30 tablet 0  . propranolol (INDERAL) 20 MG tablet Take 20 mg by mouth as needed.    . rosuvastatin (CRESTOR) 5 MG tablet Take 5 mg by mouth daily.    . vitamin B-12 (CYANOCOBALAMIN) 100 MCG tablet Take 100 mcg by mouth daily.     No current facility-administered medications for this visit.   No results found.  Review of Systems:   A ROS was performed including pertinent positives and negatives as documented in the HPI.   Musculoskeletal Exam:     Right shoulder incisions are healed.  In the sitting position Andrew is able to forward elevate to 160 degrees with external rotation at the side to 50 degrees.  Internal rotation is to L5.  Distal neurosensory exam is intact.  Forward elevation external rotation strength equal to contralateral side  Imaging:      I personally reviewed and interpreted the radiographs.   Assessment:   status post  right shoulder labral repair overall doing well.  Andrew is having evidence of needing scapular dyskinesis for which I have reported and recommended a subscapular injection as well as a posterior glenohumeral injection at today's visit.  Plan to see him back in 3 months for reassessment.  I did discuss that we may ultimately perform an additional MRI if Andrew is not getting complete relief from this injection right shoulder ultrasound-guided injection  Plan :      Procedure Note  Patient: Andrew Cunningham             Date of Birth: 06/22/1971           MRN: [id number]             Visit Date: 09/09/2023  Procedures: Visit Diagnoses: No diagnosis found.  Large Joint Inj: R glenohumeral on 09/09/2023 5:10 PM Indications: pain Details: 22 G 1.5 in needle, ultrasound-guided anterior approach  Arthrogram: No  Medications: 4 mL lidocaine 1 %; 80 mg triamcinolone acetonide 40 MG/ML Outcome: tolerated well, no immediate complications Procedure, treatment alternatives, risks and benefits explained, specific risks discussed. Consent was given by the patient. Immediately prior to procedure a time out was called to verify the correct patient, procedure, equipment, support staff and site/side marked as required. Patient was prepped and draped in the usual sterile fashion.        I personally saw and evaluated the patient, and participated in the management and treatment plan.  Huel Cote, MD Attending Physician, Orthopedic Surgery  This document was dictated using Dragon voice recognition software. A reasonable attempt at proof reading has been made to minimize errors.

## 2023-09-16 ENCOUNTER — Encounter (HOSPITAL_BASED_OUTPATIENT_CLINIC_OR_DEPARTMENT_OTHER): Payer: Self-pay | Admitting: Orthopaedic Surgery

## 2023-09-23 ENCOUNTER — Ambulatory Visit (HOSPITAL_BASED_OUTPATIENT_CLINIC_OR_DEPARTMENT_OTHER): Payer: No Typology Code available for payment source | Admitting: Orthopaedic Surgery

## 2023-11-13 ENCOUNTER — Ambulatory Visit (HOSPITAL_BASED_OUTPATIENT_CLINIC_OR_DEPARTMENT_OTHER): Payer: No Typology Code available for payment source | Admitting: Orthopaedic Surgery

## 2023-11-13 DIAGNOSIS — S43431A Superior glenoid labrum lesion of right shoulder, initial encounter: Secondary | ICD-10-CM | POA: Diagnosis not present

## 2023-11-13 NOTE — Progress Notes (Signed)
Post Operative Evaluation    Procedure/Date of Surgery: Right shoulder labral repair 1/16  Interval History:    Presents today for follow-up status post the above procedure.  Overall he is still persistently having some irritation and pain in the posterior glenohumeral joint with abduction.  This is limiting him as he is more active in certain activities.   PMH/PSH/Family History/Social History/Meds/Allergies:    Past Medical History:  Diagnosis Date   Anxiety    per patient    Cervical spine arthritis    COVID-19    ED (erectile dysfunction)    GERD (gastroesophageal reflux disease)    per patient    Hyperlipemia    per patient    Hypertension    per patient    PONV (postoperative nausea and vomiting)    Situational stress    Past Surgical History:  Procedure Laterality Date   APPENDECTOMY  2005   HERNIA REPAIR  1992   SHOULDER ARTHROSCOPY WITH LABRAL REPAIR Right 11/11/2022   Procedure: RIGHT SHOULDER ARTHROSCOPY WITH LABRAL REPAIR AND LOOSE BODY REMOVAL;  Surgeon: Huel Cote, MD;  Location: [facility];  Service: Orthopedics;  Laterality: Right;   Social History   Socioeconomic History   Marital status: Married    Spouse name: Not on file   Number of children: Not on file   Years of education: Not on file   Highest education level: Not on file  Occupational History   Not on file  Tobacco Use   Smoking status: Former   Smokeless tobacco: Former    Types: Engineer, drilling   Vaping status: Never Used  Substance and Sexual Activity   Alcohol use: Yes    Comment: 2 drinks daily    Drug use: Not Currently   Sexual activity: Not on file  Other Topics Concern   Not on file  Social History Narrative   Not on file   Social Drivers of Health   Financial Resource Strain: Not on file  Food Insecurity: Not on file  Transportation Needs: Not on file  Physical Activity: Not on file  Stress: Not on file   Social Connections: Not on file   Family History  Problem Relation Age of Onset   Healthy Mother    Arthritis Mother    Healthy Father    Healthy Sister    Healthy Daughter    No Known Allergies Current Outpatient Medications  Medication Sig Dispense Refill   ALPRAZolam (XANAX) 0.5 MG tablet Take 0.5 mg by mouth daily as needed.     aspirin EC 325 MG tablet Take 1 tablet (325 mg total) by mouth daily. 30 tablet 0   calcium-vitamin D (OSCAL WITH D) 500-5 MG-MCG tablet Take 1 tablet by mouth.     cetirizine (ZYRTEC) 10 MG tablet Take 10 mg by mouth daily.     influenza vac split quadrivalent PF (FLUARIX) 0.5 ML injection Inject into the muscle. 0.5 mL 0   losartan (COZAAR) 50 MG tablet Take 50 mg by mouth daily.     omeprazole (PRILOSEC) 10 MG capsule Take 10 mg by mouth daily.     oxyCODONE (ROXICODONE) 5 MG immediate release tablet Take 1 tablet (5 mg total) by mouth every 4 (four) hours as needed for severe pain or breakthrough pain. 30 tablet  0   propranolol (INDERAL) 20 MG tablet Take 20 mg by mouth as needed.     rosuvastatin (CRESTOR) 5 MG tablet Take 5 mg by mouth daily.     vitamin B-12 (CYANOCOBALAMIN) 100 MCG tablet Take 100 mcg by mouth daily.     No current facility-administered medications for this visit.   No results found.  Review of Systems:   A ROS was performed including pertinent positives and negatives as documented in the HPI.   Musculoskeletal Exam:     Right shoulder incisions are healed.  In the sitting position he is able to forward elevate to 160 degrees with external rotation at the side to 50 degrees.  Internal rotation is to L5.  Distal neurosensory exam is intact.  Forward elevation external rotation strength equal to contralateral side  Imaging:      I personally reviewed and interpreted the radiographs.   Assessment:   Status post right shoulder labral repair now with persistent pain and clicking in the posterior glenohumeral joint with  abduction.  I did describe that this could have multiple etiologies.  Overall I would be concerned with recurrent posterior instability or some type of advanced posterior chondral wear.  Overall given the fact that he is still symptomatic and limited in abduction I would like to obtain an MRI of the right shoulder so I can help further counsel him following his initial surgery  Plan :    -Plan for MRI right shoulder and follow-up to discuss results    I personally saw and evaluated the patient, and participated in the management and treatment plan.  Huel Cote, MD Attending Physician, Orthopedic Surgery  This document was dictated using Dragon voice recognition software. A reasonable attempt at proof reading has been made to minimize errors.

## 2023-11-19 ENCOUNTER — Encounter (HOSPITAL_BASED_OUTPATIENT_CLINIC_OR_DEPARTMENT_OTHER): Payer: Self-pay | Admitting: Orthopaedic Surgery

## 2023-11-20 ENCOUNTER — Ambulatory Visit
Admission: RE | Admit: 2023-11-20 | Discharge: 2023-11-20 | Disposition: A | Discharge status: Home / Self Care | Payer: No Typology Code available for payment source | Source: Ambulatory Visit | Attending: Orthopaedic Surgery | Admitting: Orthopaedic Surgery

## 2023-11-20 DIAGNOSIS — S43431A Superior glenoid labrum lesion of right shoulder, initial encounter: Secondary | ICD-10-CM

## 2023-11-27 ENCOUNTER — Encounter (HOSPITAL_BASED_OUTPATIENT_CLINIC_OR_DEPARTMENT_OTHER): Payer: Self-pay | Admitting: Orthopaedic Surgery

## 2023-11-27 ENCOUNTER — Ambulatory Visit (HOSPITAL_BASED_OUTPATIENT_CLINIC_OR_DEPARTMENT_OTHER): Payer: No Typology Code available for payment source | Admitting: Orthopaedic Surgery

## 2023-11-27 DIAGNOSIS — S43431A Superior glenoid labrum lesion of right shoulder, initial encounter: Secondary | ICD-10-CM

## 2023-11-27 NOTE — Progress Notes (Signed)
Post Operative Evaluation    Procedure/Date of Surgery: Right shoulder labral repair 1/16  Interval History:   11/27/2023: Presents today for follow-up with MRI discussion of the right shoulder.  Presents today for follow-up status post the above procedure.  Overall he is still persistently having some irritation and pain in the posterior glenohumeral joint with abduction.  This is limiting him as he is more active in certain activities.   PMH/PSH/Family History/Social History/Meds/Allergies:    Past Medical History:  Diagnosis Date   Anxiety    per patient    Cervical spine arthritis    COVID-19    ED (erectile dysfunction)    GERD (gastroesophageal reflux disease)    per patient    Hyperlipemia    per patient    Hypertension    per patient    PONV (postoperative nausea and vomiting)    Situational stress    Past Surgical History:  Procedure Laterality Date   APPENDECTOMY  2005   HERNIA REPAIR  1992   SHOULDER ARTHROSCOPY WITH LABRAL REPAIR Right 11/11/2022   Procedure: RIGHT SHOULDER ARTHROSCOPY WITH LABRAL REPAIR AND LOOSE BODY REMOVAL;  Surgeon: Huel Cote, MD;  Location: [facility];  Service: Orthopedics;  Laterality: Right;   Social History   Socioeconomic History   Marital status: Married    Spouse name: Not on file   Number of children: Not on file   Years of education: Not on file   Highest education level: Not on file  Occupational History   Not on file  Tobacco Use   Smoking status: Former   Smokeless tobacco: Former    Types: Engineer, drilling   Vaping status: Never Used  Substance and Sexual Activity   Alcohol use: Yes    Comment: 2 drinks daily    Drug use: Not Currently   Sexual activity: Not on file  Other Topics Concern   Not on file  Social History Narrative   Not on file   Social Drivers of Health   Financial Resource Strain: Not on file  Food Insecurity: Not on file   Transportation Needs: Not on file  Physical Activity: Not on file  Stress: Not on file  Social Connections: Not on file   Family History  Problem Relation Age of Onset   Healthy Mother    Arthritis Mother    Healthy Father    Healthy Sister    Healthy Daughter    No Known Allergies Current Outpatient Medications  Medication Sig Dispense Refill   ALPRAZolam (XANAX) 0.5 MG tablet Take 0.5 mg by mouth daily as needed.     aspirin EC 325 MG tablet Take 1 tablet (325 mg total) by mouth daily. 30 tablet 0   calcium-vitamin D (OSCAL WITH D) 500-5 MG-MCG tablet Take 1 tablet by mouth.     cetirizine (ZYRTEC) 10 MG tablet Take 10 mg by mouth daily.     influenza vac split quadrivalent PF (FLUARIX) 0.5 ML injection Inject into the muscle. 0.5 mL 0   losartan (COZAAR) 50 MG tablet Take 50 mg by mouth daily.     omeprazole (PRILOSEC) 10 MG capsule Take 10 mg by mouth daily.     oxyCODONE (ROXICODONE) 5 MG immediate release tablet Take 1 tablet (5 mg total) by mouth every 4 (  four) hours as needed for severe pain or breakthrough pain. 30 tablet 0   propranolol (INDERAL) 20 MG tablet Take 20 mg by mouth as needed.     rosuvastatin (CRESTOR) 5 MG tablet Take 5 mg by mouth daily.     vitamin B-12 (CYANOCOBALAMIN) 100 MCG tablet Take 100 mcg by mouth daily.     No current facility-administered medications for this visit.   No results found.  Review of Systems:   A ROS was performed including pertinent positives and negatives as documented in the HPI.   Musculoskeletal Exam:     Right shoulder incisions are healed.  In the sitting position he is able to forward elevate to 160 degrees with external rotation at the side to 50 degrees.  Internal rotation is to L5.  Distal neurosensory exam is intact.  Forward elevation external rotation strength equal to contralateral side  Imaging:     MRI right shoulder: There is some cystic changes involving the posterior aspect of the glenoid consistent  with primary osteoarthritis  I personally reviewed and interpreted the radiographs.   Assessment:   Status post right shoulder labral repair now with persistent pain and clicking in the posterior glenohumeral joint with abduction.  I did discuss that his exam also today is consistent with scapular dyskinesis.  We having somewhat of a difficult time working out whether or not this is scapular dyskinesis versus glenohumeral based pain.  Overall I am leaning toward scapular dyskinesis.  In order to further work him up I would like to perform a ultrasound-guided injections of the subacromial space as well as the medial scapula in order to rule out or rule in scapular dyskinesis.  I would like him to send me a message with how much relief he gets.  I do believe he would specifically benefit from physical therapy for anterior chain mobilization and posterior chain strengthening.  He would not like to do dry needling as he has had this in the past in the past and did not prefer this Plan :    -Will send me a MyChart message with his progress later today and I will plan to see him back in 6 weeks    I personally saw and evaluated the patient, and participated in the management and treatment plan.  Huel Cote, MD Attending Physician, Orthopedic Surgery  This document was dictated using Dragon voice recognition software. A reasonable attempt at proof reading has been made to minimize errors.

## 2024-01-09 ENCOUNTER — Telehealth (HOSPITAL_BASED_OUTPATIENT_CLINIC_OR_DEPARTMENT_OTHER): Payer: Self-pay | Admitting: Physical Therapy

## 2024-01-09 NOTE — Telephone Encounter (Signed)
 Called and LVM to remind patient of upcoming physical therapy evaluation appointment.

## 2024-01-12 ENCOUNTER — Ambulatory Visit (HOSPITAL_BASED_OUTPATIENT_CLINIC_OR_DEPARTMENT_OTHER): Payer: No Typology Code available for payment source | Attending: Orthopaedic Surgery | Admitting: Physical Therapy

## 2024-01-12 ENCOUNTER — Other Ambulatory Visit: Payer: Self-pay

## 2024-01-12 ENCOUNTER — Encounter (HOSPITAL_BASED_OUTPATIENT_CLINIC_OR_DEPARTMENT_OTHER): Payer: Self-pay | Admitting: Physical Therapy

## 2024-01-12 DIAGNOSIS — R293 Abnormal posture: Secondary | ICD-10-CM | POA: Diagnosis present

## 2024-01-12 DIAGNOSIS — S43431A Superior glenoid labrum lesion of right shoulder, initial encounter: Secondary | ICD-10-CM | POA: Insufficient documentation

## 2024-01-12 DIAGNOSIS — M6281 Muscle weakness (generalized): Secondary | ICD-10-CM | POA: Diagnosis present

## 2024-01-12 NOTE — Therapy (Signed)
 [email or web address] OUTPATIENT PHYSICAL THERAPY EVALUATION   Patient Name: Andrew Cunningham MRN: [id number] DOB:[birth year], 53 y.o., male Today's Date: 01/12/2024  END OF SESSION:  PT End of Session - 01/12/24 0848     Visit Number 1    Number of Visits 5    Date for PT Re-Evaluation 02/27/24    Authorization Type [employer]    PT Start Time 0800    PT Stop Time 0846    PT Time Calculation (min) 46 min    Activity Tolerance Patient tolerated treatment well    Behavior During Therapy WFL for tasks assessed/performed             Past Medical History:  Diagnosis Date   Anxiety    per patient    Cervical spine arthritis    COVID-19    ED (erectile dysfunction)    GERD (gastroesophageal reflux disease)    per patient    Hyperlipemia    per patient    Hypertension    per patient    PONV (postoperative nausea and vomiting)    Situational stress    Past Surgical History:  Procedure Laterality Date   APPENDECTOMY  2005   HERNIA REPAIR  1992   SHOULDER ARTHROSCOPY WITH LABRAL REPAIR Right 11/11/2022   Procedure: RIGHT SHOULDER ARTHROSCOPY WITH LABRAL REPAIR AND LOOSE BODY REMOVAL;  Surgeon: Huel Cote, MD;  Location: [facility];  Service: Orthopedics;  Laterality: Right;   Patient Active Problem List   Diagnosis Date Noted   Labral tear of shoulder, right, initial encounter 11/11/2022   Cervical spine arthritis 08/22/2020   History of anxiety 08/22/2020   History of gastroesophageal reflux (GERD) 08/22/2020   Essential hypertension 08/22/2020   History of hyperlipidemia 08/22/2020    REFERRING PROVIDER:  Huel Cote, MD     REFERRING DIAG:  [phone] (ICD-10-CM) - Labral tear of shoulder, right, initial encounter      Rationale for Evaluation and Treatment: Rehabilitation  THERAPY DIAG:  Muscle weakness (generalized)  Abnormal posture  ONSET DATE: surgery 10/15/22, discomfort about 2 months ago   SUBJECTIVE:                                                                                                                                                                                            SUBJECTIVE STATEMENT: Injections helped 95%, has played golf with very little issue.   PERTINENT HISTORY:  H/o Labral repair, cervical spine arthritis  PAIN:  Are you having pain? No  PRECAUTIONS:  None  RED FLAGS: None   WEIGHT BEARING RESTRICTIONS:  No  FALLS:  Has patient fallen in  last 6 months? No  PATIENT GOALS:  Correct dyskinesis   OBJECTIVE:  Note: Objective measures were completed at Evaluation unless otherwise noted.  PATIENT SURVEYS:  UEFI to be tested  EVAL: ROM is full and gross strength of 5/5 scapular winging with significant elevation in overhead and diagonal motions consistent with dyskinesis2                                                                                                                            TREATMENT DATE:   EVAL 3/18: Cable diagonals with tactile cuing for scapular motion AROM diagonals with scapular control Low trap set from wall- core+ scap retraction/depression Elbow planks, + protraction, + forward reach Seated upper trap ischemic release   PATIENT EDUCATION:  Education details: Anatomy of condition, POC, HEP, exercise form/rationale Person educated: Patient Education method: Explanation, Demonstration, Tactile cues, Verbal cues, and Handouts Education comprehension: verbalized understanding, returned demonstration, verbal cues required, tactile cues required, and needs further education  HOME EXERCISE PROGRAM: [facility]   ASSESSMENT:  CLINICAL IMPRESSION: Patient is a 53 y.o. M who was seen today for physical therapy evaluation and treatment for scapular dyskinesis. Pt went through rehab successfully following surgical intervention for labral tear, recently began having tightness and discomfort at medial border of scapula for which he had injections and was very  helpful. We discussed need for high level training for eccentric control of protraction and decreasing shoulder elevation via upper trap overuse. Gave exercises today that required cuing and will f/u in a week to progress PRN. Pt will benefit from skilled PT for proper challenges/neuro re-education to control scapula and decrease risk of future injury.      REHAB POTENTIAL: Good  CLINICAL DECISION MAKING: Stable/uncomplicated  EVALUATION COMPLEXITY: Low   GOALS: Goals reviewed with patient? Yes  SHORT TERM GOALS=LTGs  LONG TERM GOALS: Target date: POC date  Able to perform eccentric protraction with proper form/control to decrease strain in activities such as golf and pickle ball Baseline:  Goal status: INITIAL  2.  Verbalize significant decrease in upper trap tightness with overhead activities.  Baseline:  Goal status: INITIAL  3.  Lift at least 10lb overhead with controlled hold Baseline:  Goal status: INITIAL     PLAN:  PT FREQUENCY: 1x/week  PT DURATION:  POC Date  PLANNED INTERVENTIONS: 97164- PT Re-evaluation, 97110-Therapeutic exercises, 97530- Therapeutic activity, [id number]- Neuromuscular re-education, 97535- Self Care, 82956- Manual therapy, Patient/Family education, Taping, Dry Needling, Joint mobilization, Spinal mobilization, Scar mobilization, Cryotherapy, and Moist heat.  PLAN FOR NEXT SESSION: review diagonals- sidelying eccentrics of shoulder   Chawn Spraggins C. Aryeh Butterfield PT, DPT 01/12/24 10:41 AM

## 2024-01-15 ENCOUNTER — Ambulatory Visit (HOSPITAL_BASED_OUTPATIENT_CLINIC_OR_DEPARTMENT_OTHER): Payer: No Typology Code available for payment source | Admitting: Orthopaedic Surgery

## 2024-01-15 DIAGNOSIS — M25511 Pain in right shoulder: Secondary | ICD-10-CM

## 2024-01-15 DIAGNOSIS — G8929 Other chronic pain: Secondary | ICD-10-CM | POA: Diagnosis not present

## 2024-01-15 NOTE — Progress Notes (Signed)
 Post Operative Evaluation    Procedure/Date of Surgery: Right shoulder labral repair 1/16  Interval History:   01/15/2024: Presents today for follow-up of the right shoulder.  Overall he is feeling much better despite having some scapular dyskinesis.  He was able to recently golf without any pain  Presents today for follow-up status post the above procedure.  Overall he is still persistently having some irritation and pain in the posterior glenohumeral joint with abduction.  This is limiting him as he is more active in certain activities.   PMH/PSH/Family History/Social History/Meds/Allergies:    Past Medical History:  Diagnosis Date   Anxiety    per patient    Cervical spine arthritis    COVID-19    ED (erectile dysfunction)    GERD (gastroesophageal reflux disease)    per patient    Hyperlipemia    per patient    Hypertension    per patient    PONV (postoperative nausea and vomiting)    Situational stress    Past Surgical History:  Procedure Laterality Date   APPENDECTOMY  2005   HERNIA REPAIR  1992   SHOULDER ARTHROSCOPY WITH LABRAL REPAIR Right 11/11/2022   Procedure: RIGHT SHOULDER ARTHROSCOPY WITH LABRAL REPAIR AND LOOSE BODY REMOVAL;  Surgeon: Huel Cote, MD;  Location: [facility];  Service: Orthopedics;  Laterality: Right;   Social History   Socioeconomic History   Marital status: Married    Spouse name: Not on file   Number of children: Not on file   Years of education: Not on file   Highest education level: Not on file  Occupational History   Not on file  Tobacco Use   Smoking status: Former   Smokeless tobacco: Former    Types: Engineer, drilling   Vaping status: Never Used  Substance and Sexual Activity   Alcohol use: Yes    Comment: 2 drinks daily    Drug use: Not Currently   Sexual activity: Not on file  Other Topics Concern   Not on file  Social History Narrative   Not on file   Social  Drivers of Health   Financial Resource Strain: Not on file  Food Insecurity: Not on file  Transportation Needs: Not on file  Physical Activity: Not on file  Stress: Not on file  Social Connections: Not on file   Family History  Problem Relation Age of Onset   Healthy Mother    Arthritis Mother    Healthy Father    Healthy Sister    Healthy Daughter    No Known Allergies Current Outpatient Medications  Medication Sig Dispense Refill   ALPRAZolam (XANAX) 0.5 MG tablet Take 0.5 mg by mouth daily as needed.     aspirin EC 325 MG tablet Take 1 tablet (325 mg total) by mouth daily. 30 tablet 0   calcium-vitamin D (OSCAL WITH D) 500-5 MG-MCG tablet Take 1 tablet by mouth.     cetirizine (ZYRTEC) 10 MG tablet Take 10 mg by mouth daily.     influenza vac split quadrivalent PF (FLUARIX) 0.5 ML injection Inject into the muscle. 0.5 mL 0   losartan (COZAAR) 50 MG tablet Take 50 mg by mouth daily.     omeprazole (PRILOSEC) 10 MG capsule Take 10 mg by mouth daily.  oxyCODONE (ROXICODONE) 5 MG immediate release tablet Take 1 tablet (5 mg total) by mouth every 4 (four) hours as needed for severe pain or breakthrough pain. 30 tablet 0   propranolol (INDERAL) 20 MG tablet Take 20 mg by mouth as needed.     rosuvastatin (CRESTOR) 5 MG tablet Take 5 mg by mouth daily.     vitamin B-12 (CYANOCOBALAMIN) 100 MCG tablet Take 100 mcg by mouth daily.     No current facility-administered medications for this visit.   No results found.  Review of Systems:   A ROS was performed including pertinent positives and negatives as documented in the HPI.   Musculoskeletal Exam:     Right shoulder incisions are healed.  In the sitting position he is able to forward elevate to 160 degrees with external rotation at the side to 50 degrees.  Internal rotation is to L5.  Distal neurosensory exam is intact.  Forward elevation external rotation strength equal to contralateral side  Imaging:     MRI right  shoulder: There is some cystic changes involving the posterior aspect of the glenoid consistent with primary osteoarthritis  I personally reviewed and interpreted the radiographs.   Assessment:   Status post right shoulder labral repair now with persistent pain and clicking in the posterior glenohumeral joint with abduction.  I did discuss that his exam also today is consistent with scapular dyskinesis.  We having somewhat of a difficult time working out whether or not this is scapular dyskinesis versus glenohumeral based pain.  He did get extremely good relief from an injection about the medial scapular region and is working with physical therapy which is going quite well.  Discussed that we could perform a subscapular bursal injection as needed every 6 months.  I will plan to see him back as needed at this time Plan :    -Return to clinic as needed    I personally saw and evaluated the patient, and participated in the management and treatment plan.  Huel Cote, MD Attending Physician, Orthopedic Surgery  This document was dictated using Dragon voice recognition software. A reasonable attempt at proof reading has been made to minimize errors.

## 2024-01-21 ENCOUNTER — Encounter (HOSPITAL_BASED_OUTPATIENT_CLINIC_OR_DEPARTMENT_OTHER): Admitting: Physical Therapy

## 2024-07-14 ENCOUNTER — Telehealth (HOSPITAL_BASED_OUTPATIENT_CLINIC_OR_DEPARTMENT_OTHER): Payer: Self-pay | Admitting: Orthopaedic Surgery

## 2024-07-14 NOTE — Telephone Encounter (Signed)
 Patient would like to be worked in for an rt shoulder injection. I offered the walking he prefers to see Dr B. Please advise.

## 2024-07-21 ENCOUNTER — Ambulatory Visit (HOSPITAL_BASED_OUTPATIENT_CLINIC_OR_DEPARTMENT_OTHER): Admitting: Orthopaedic Surgery

## 2024-07-21 DIAGNOSIS — G8929 Other chronic pain: Secondary | ICD-10-CM | POA: Diagnosis not present

## 2024-07-21 DIAGNOSIS — M25511 Pain in right shoulder: Secondary | ICD-10-CM | POA: Diagnosis not present

## 2024-07-21 MED ORDER — LIDOCAINE HCL 1 % IJ SOLN
4.0000 mL | INTRAMUSCULAR | Status: AC | PRN
  Administered 2024-07-21: 4 mL

## 2024-07-21 MED ORDER — TRIAMCINOLONE ACETONIDE 40 MG/ML IJ SUSP
80.0000 mg | INTRAMUSCULAR | Status: AC | PRN
  Administered 2024-07-21: 80 mg via INTRA_ARTICULAR

## 2024-07-21 NOTE — Progress Notes (Signed)
 Post Operative Evaluation    Procedure/Date of Surgery: Right shoulder labral repair 1/16  Interval History:   07/21/2024: Presents today for follow-up of the right shoulder.  He presents today for scapular bursa injection as he did get relief from this previously   PMH/PSH/Family History/Social History/Meds/Allergies:    Past Medical History:  Diagnosis Date  . Anxiety    per patient   . Cervical spine arthritis   . COVID-19   . ED (erectile dysfunction)   . GERD (gastroesophageal reflux disease)    per patient   . Hyperlipemia    per patient   . Hypertension    per patient   . PONV (postoperative nausea and vomiting)   . Situational stress    Past Surgical History:  Procedure Laterality Date  . APPENDECTOMY  2005  . HERNIA REPAIR  1992  . SHOULDER ARTHROSCOPY WITH LABRAL REPAIR Right 11/11/2022   Procedure: RIGHT SHOULDER ARTHROSCOPY WITH LABRAL REPAIR AND LOOSE BODY REMOVAL;  Surgeon: Genelle Standing, MD;  Location: [facility];  Service: Orthopedics;  Laterality: Right;   Social History   Socioeconomic History  . Marital status: Married    Spouse name: Not on file  . Number of children: Not on file  . Years of education: Not on file  . Highest education level: Not on file  Occupational History  . Not on file  Tobacco Use  . Smoking status: Former  . Smokeless tobacco: Former    Types: Engineer, drilling  . Vaping status: Never Used  Substance and Sexual Activity  . Alcohol use: Yes    Comment: 2 drinks daily   . Drug use: Not Currently  . Sexual activity: Not on file  Other Topics Concern  . Not on file  Social History Narrative  . Not on file   Social Drivers of Health   Financial Resource Strain: Not on file  Food Insecurity: Not on file  Transportation Needs: Not on file  Physical Activity: Not on file  Stress: Not on file  Social Connections: Not on file   Family History  Problem Relation Age  of Onset  . Healthy Mother   . Arthritis Mother   . Healthy Father   . Healthy Sister   . Healthy Daughter    No Known Allergies Current Outpatient Medications  Medication Sig Dispense Refill  . ALPRAZolam (XANAX) 0.5 MG tablet Take 0.5 mg by mouth daily as needed.    . aspirin  EC 325 MG tablet Take 1 tablet (325 mg total) by mouth daily. 30 tablet 0  . calcium-vitamin D (OSCAL WITH D) 500-5 MG-MCG tablet Take 1 tablet by mouth.    . cetirizine (ZYRTEC) 10 MG tablet Take 10 mg by mouth daily.    . influenza vac split quadrivalent PF (FLUARIX) 0.5 ML injection Inject into the muscle. 0.5 mL 0  . losartan (COZAAR) 50 MG tablet Take 50 mg by mouth daily.    SABRA omeprazole (PRILOSEC) 10 MG capsule Take 10 mg by mouth daily.    . oxyCODONE  (ROXICODONE ) 5 MG immediate release tablet Take 1 tablet (5 mg total) by mouth every 4 (four) hours as needed for severe pain or breakthrough pain. 30 tablet 0  . propranolol (INDERAL) 20 MG tablet Take 20 mg by mouth as needed.    SABRA  rosuvastatin (CRESTOR) 5 MG tablet Take 5 mg by mouth daily.    . vitamin B-12 (CYANOCOBALAMIN) 100 MCG tablet Take 100 mcg by mouth daily.     No current facility-administered medications for this visit.   No results found.  Review of Systems:   A ROS was performed including pertinent positives and negatives as documented in the HPI.   Musculoskeletal Exam:     Right shoulder incisions are healed.  In the sitting position he is able to forward elevate to 160 degrees with external rotation at the side to 50 degrees.  Internal rotation is to L5.  Distal neurosensory exam is intact.  Forward elevation external rotation strength equal to contralateral side  Imaging:     MRI right shoulder: There is some cystic changes involving the posterior aspect of the glenoid consistent with primary osteoarthritis  I personally reviewed and interpreted the radiographs.   Assessment:   Status post right shoulder scapular  dyskinesis as well as posterior scapular bursitis.  He is requesting an additional injection today which I have provided after verbal consent was obtained Plan :    - Right shoulder posterior scapular injection  Verbal consent obtained    Procedure Note  Patient: Andrew Cunningham             Date of Birth: [birth year]           MRN: [id number]             Visit Date: 07/21/2024  Procedures: Visit Diagnoses: No diagnosis found.  Large Joint Inj on 07/21/2024 1:21 PM Indications: pain Details: 22 G 1.5 in needle, ultrasound-guided posterior approach  Arthrogram: No  Medications: 4 mL lidocaine  1 %; 80 mg triamcinolone  acetonide 40 MG/ML Outcome: tolerated well, no immediate complications Procedure, treatment alternatives, risks and benefits explained, specific risks discussed. Consent was given by the patient. Immediately prior to procedure a time out was called to verify the correct patient, procedure, equipment, support staff and site/side marked as required. Patient was prepped and draped in the usual sterile fashion.          I personally saw and evaluated the patient, and participated in the management and treatment plan.  Elspeth Parker, MD Attending Physician, Orthopedic Surgery  This document was dictated using Dragon voice recognition software. A reasonable attempt at proof reading has been made to minimize errors.

## 2024-08-29 ENCOUNTER — Encounter: Payer: Self-pay | Admitting: Radiology
